# Patient Record
Sex: Male | Born: 1935 | Race: White | Hispanic: No | Smoking: Former smoker
Health system: Southern US, Community
[De-identification: ages and names within clinical notes are randomized; demographics above are authoritative.]

## PROBLEM LIST (undated history)

## (undated) DIAGNOSIS — F0390 Unspecified dementia without behavioral disturbance: Secondary | ICD-10-CM

## (undated) DIAGNOSIS — D519 Vitamin B12 deficiency anemia, unspecified: Secondary | ICD-10-CM

## (undated) DIAGNOSIS — I10 Essential (primary) hypertension: Secondary | ICD-10-CM

## (undated) DIAGNOSIS — E119 Type 2 diabetes mellitus without complications: Secondary | ICD-10-CM

## (undated) DIAGNOSIS — E785 Hyperlipidemia, unspecified: Secondary | ICD-10-CM

## (undated) DIAGNOSIS — M21379 Foot drop, unspecified foot: Secondary | ICD-10-CM

---

## 2018-03-12 ENCOUNTER — Emergency Department (HOSPITAL_COMMUNITY)
Admission: EM | Admit: 2018-03-12 | Discharge: 2018-03-13 | Disposition: A | Discharge status: Home / Self Care | Payer: Medicare Other | Attending: Emergency Medicine | Admitting: Emergency Medicine

## 2018-03-12 ENCOUNTER — Other Ambulatory Visit: Payer: Self-pay

## 2018-03-12 ENCOUNTER — Emergency Department (HOSPITAL_COMMUNITY): Payer: Medicare Other

## 2018-03-12 ENCOUNTER — Encounter (HOSPITAL_COMMUNITY): Payer: Self-pay

## 2018-03-12 DIAGNOSIS — Y939 Activity, unspecified: Secondary | ICD-10-CM | POA: Insufficient documentation

## 2018-03-12 DIAGNOSIS — F0390 Unspecified dementia without behavioral disturbance: Secondary | ICD-10-CM | POA: Insufficient documentation

## 2018-03-12 DIAGNOSIS — M542 Cervicalgia: Secondary | ICD-10-CM | POA: Insufficient documentation

## 2018-03-12 DIAGNOSIS — W010XXA Fall on same level from slipping, tripping and stumbling without subsequent striking against object, initial encounter: Secondary | ICD-10-CM | POA: Insufficient documentation

## 2018-03-12 DIAGNOSIS — Y92129 Unspecified place in nursing home as the place of occurrence of the external cause: Secondary | ICD-10-CM | POA: Diagnosis not present

## 2018-03-12 DIAGNOSIS — W19XXXA Unspecified fall, initial encounter: Secondary | ICD-10-CM

## 2018-03-12 DIAGNOSIS — Z79899 Other long term (current) drug therapy: Secondary | ICD-10-CM | POA: Insufficient documentation

## 2018-03-12 DIAGNOSIS — Y999 Unspecified external cause status: Secondary | ICD-10-CM | POA: Diagnosis not present

## 2018-03-12 LAB — CBC WITH DIFFERENTIAL/PLATELET
Basophils Absolute: 0 10*3/uL (ref 0.0–0.1)
Basophils Relative: 0 %
Eosinophils Absolute: 0.1 10*3/uL (ref 0.0–0.7)
Eosinophils Relative: 1 %
HEMATOCRIT: 37.2 % — AB (ref 39.0–52.0)
Hemoglobin: 12.4 g/dL — ABNORMAL LOW (ref 13.0–17.0)
LYMPHS ABS: 0.8 10*3/uL (ref 0.7–4.0)
LYMPHS PCT: 9 %
MCH: 30.1 pg (ref 26.0–34.0)
MCHC: 33.3 g/dL (ref 30.0–36.0)
MCV: 90.3 fL (ref 78.0–100.0)
MONO ABS: 0.7 10*3/uL (ref 0.1–1.0)
MONOS PCT: 8 %
NEUTROS ABS: 6.8 10*3/uL (ref 1.7–7.7)
NEUTROS PCT: 82 %
Platelets: 159 10*3/uL (ref 150–400)
RBC: 4.12 MIL/uL — ABNORMAL LOW (ref 4.22–5.81)
RDW: 13.3 % (ref 11.5–15.5)
WBC: 8.3 10*3/uL (ref 4.0–10.5)

## 2018-03-12 LAB — TROPONIN I: Troponin I: <0.03 ng/mL (ref <0.03)

## 2018-03-12 LAB — URINALYSIS, ROUTINE W REFLEX MICROSCOPIC
Bilirubin Urine: NEGATIVE
GLUCOSE, UA: 50 mg/dL — AB
KETONES UR: NEGATIVE mg/dL
Leukocytes, UA: NEGATIVE
NITRITE: NEGATIVE
PH: 5 (ref 5.0–8.0)
Protein, ur: 30 mg/dL — AB
SPECIFIC GRAVITY, URINE: 1.016 (ref 1.005–1.030)

## 2018-03-12 LAB — BASIC METABOLIC PANEL
ANION GAP: 10 (ref 5–15)
BUN: 28 mg/dL — ABNORMAL HIGH (ref 6–20)
CHLORIDE: 111 mmol/L (ref 101–111)
CO2: 25 mmol/L (ref 22–32)
Calcium: 8.6 mg/dL — ABNORMAL LOW (ref 8.9–10.3)
Creatinine, Ser: 1.52 mg/dL — ABNORMAL HIGH (ref 0.61–1.24)
GFR calc non Af Amer: 41 mL/min — ABNORMAL LOW (ref >60)
GFR, EST AFRICAN AMERICAN: 47 mL/min — AB (ref >60)
GLUCOSE: 99 mg/dL (ref 65–99)
POTASSIUM: 3.8 mmol/L (ref 3.5–5.1)
Sodium: 146 mmol/L — ABNORMAL HIGH (ref 135–145)

## 2018-03-12 LAB — CBG MONITORING, ED: GLUCOSE-CAPILLARY: 105 mg/dL — AB (ref 65–99)

## 2018-03-12 MED ORDER — SODIUM CHLORIDE 0.9 % IV BOLUS
1000.0000 mL | Freq: Once | INTRAVENOUS | Status: AC
  Administered 2018-03-12: 1000 mL via INTRAVENOUS

## 2018-03-12 NOTE — ED Notes (Signed)
Bed: [id number] Expected date:  Expected time:  Means of arrival:  Comments: Ems fall

## 2018-03-12 NOTE — ED Triage Notes (Signed)
EMS reports from [facility] in dining room at facility, unwitnessed fall, Pt c/o neck pain and states it was before fall. No other visible injuries and Pt denies pain anywhere else or from fall. Hx of dementia. `  BP 90/60 HR 84 Resp 16 SP02 96 RA  20 R hand  100ML NS enroute

## 2018-03-12 NOTE — Discharge Instructions (Addendum)
Labs and imaging are ok. Continue all home medications. Close monitor for changes in behavior, intake, output.

## 2018-03-12 NOTE — ED Notes (Signed)
[facility] has been called for transport. 

## 2018-03-12 NOTE — ED Notes (Signed)
Patient was ambulated around room, patient can not walk without assistance

## 2018-03-12 NOTE — ED Provider Notes (Signed)
[facility]-EMERGENCY DEPT Provider Note   CSN: [id number] Arrival date & time: 03/12/18  1346     History   Chief Complaint Chief Complaint  Patient presents with  . Neck Pain  . Fall    HPI Levi Russell is a 84 y.o. male  With history of dementia is here for evaluation of fall at [facility], witnessed. History is limited due to underlying dementia, level V caveat applies. Patient is alert and oriented to self and place but unsure of events, and time. He has no pain but states he is tired. History obtained from chart review and a phone call with RN at [facility]. Per [facility], patient was walking with his roller walker when a wheel got caught in a chair, he fell forward landing on the ground. RN witnessed fall, patient hit his head on the ground. There was no LOC. Patient briefly complained of neck pain but he denies this currently. Patient does not take any blood thinners. [facility] at [facility] states patient has been eating, drinking and voiding at baseline. No fevers, cough, changes in bowel movements. He was at his cognitive baseline after the fall. Aggravating factors: none. Alleviating factors: none. No associated symptoms.   HPI  History reviewed. No pertinent past medical history.  There are no active problems to display for this patient.   History reviewed. No pertinent surgical history.      Home Medications    Prior to Admission medications   Medication Sig Start Date End Date Taking? Authorizing Provider  clotrimazole (LOTRIMIN) 1 % cream Apply 1 application topically 2 (two) times daily.   Yes [provider]  Cyanocobalamin (VITAMIN B 12 PO) Take 500 mg by mouth daily.   Yes [provider]  LORazepam (ATIVAN) 0.5 MG tablet Take 0.5 mg by mouth every 6 (six) hours as needed for anxiety.   Yes [provider]  metFORMIN (GLUCOPHAGE) 1000 MG tablet Take 1,000 mg by mouth 2 (two) times daily with a  meal.   Yes [provider]  oseltamivir (TAMIFLU) 30 MG capsule Take 30 mg by mouth.   Yes [provider]  risperiDONE (RISPERDAL) 1 MG tablet Take 0.5 mg by mouth. Take twice daily and every 4 hours as needed for psychosis   Yes [provider]  traZODone (DESYREL) 50 MG tablet Take 50 mg by mouth at bedtime as needed for sleep.   Yes [provider]    Family History History reviewed. No pertinent family history.  Social History Social History   Tobacco Use  . Smoking status: Former Games developermoker  . Smokeless tobacco: Never Used  Substance Use Topics  . Alcohol use: Not on file  . Drug use: Not on file     Allergies   Patient has no allergy information on record.   Review of Systems Review of Systems  Musculoskeletal: Positive for neck pain.  Psychiatric/Behavioral:       Dementia   All other systems reviewed and are negative.    Physical Exam Updated Vital Signs BP 118/78   Pulse 68   Temp 98.1 F (36.7 C) (Oral)   Resp 16   Ht 5\' 8"  (1.727 m)   Wt 68 kg (150 lb)   SpO2 98%   BMI 22.81 kg/m   Physical Exam  Constitutional: He is oriented to person, place, and time. He appears well-developed and well-nourished. He is cooperative. He is easily aroused. No distress.  Asleep but  easily arousable to voice. Pleasantly confused.   HENT:  No abrasions, lacerations, erythema or signs of facial or head injury No scalp, facial or nasal bone tenderness No Raccoon's eyes. No Battle's sign. No hemotympanum, bilaterally. No epistaxis, septum midline No intraoral bleeding or injury  Eyes:  Lids normal EOMs and PERRL intact without pain No conjunctival injection  Neck:  No cervical spinous process tenderness No cervical paraspinal muscular tenderness Full active ROM of cervical spine Trachea midline  Cardiovascular: Normal rate, regular rhythm, S1 normal, S2 normal and normal heart sounds. Exam reveals no distant heart sounds and no  friction rub.  No murmur heard. Pulses:      Carotid pulses are 2+ on the right side, and 2+ on the left side.      Radial pulses are 2+ on the right side, and 2+ on the left side.       Dorsalis pedis pulses are 2+ on the right side, and 2+ on the left side.  Pulmonary/Chest: Effort normal and breath sounds normal. No respiratory distress. He has no decreased breath sounds.  No chest wall tenderness Equal and symmetric chest wall expansion   Abdominal:  Abdomen is soft NTND  Musculoskeletal: Normal range of motion. He exhibits no deformity.  Extremities: Normal, painless PROM of upper and lower extremities without pain or deficit. No obvious deformities.  CTL spine: no midline tenderness Pelvis: No obvious instability with Ap/L compression. Painless PROM of bilateral hips.   Neurological: He is alert, oriented to person, place, and time and easily aroused.  A&O to self, place and DOB only. Speech is fluent without obvious dysarthria or dysphasia. Strength 5/5 with hand grip and ankle F/E.   Sensation to light touch intact in hands and feet. No truncal sway. No pronator drift.  Normal finger-to-nose and finger tapping.  CN I and VIII not tested. CN II-XII grossly intact bilaterally.      ED Treatments / Results  Labs (all labs ordered are listed, but only abnormal results are displayed) Labs Reviewed  URINALYSIS, ROUTINE W REFLEX MICROSCOPIC - Abnormal; Notable for the following components:      Result Value   Glucose, UA 50 (*)    Hgb urine dipstick SMALL (*)    Protein, ur 30 (*)    Bacteria, UA RARE (*)    Squamous Epithelial / LPF 0-5 (*)    All other components within normal limits  BASIC METABOLIC PANEL - Abnormal; Notable for the following components:   Sodium 146 (*)    BUN 28 (*)    Creatinine, Ser 1.52 (*)    Calcium 8.6 (*)    GFR calc non Af Amer 41 (*)    GFR calc Af Amer 47 (*)    All other components within normal limits  CBC WITH DIFFERENTIAL/PLATELET -  Abnormal; Notable for the following components:   RBC 4.12 (*)    Hemoglobin 12.4 (*)    HCT 37.2 (*)    All other components within normal limits  CBG MONITORING, ED - Abnormal; Notable for the following components:   Glucose-Capillary 105 (*)    All other components within normal limits  TROPONIN I  CBC WITH DIFFERENTIAL/PLATELET    EKG EKG Interpretation  Date/Time:  Monday March 12 2018 14:51:34 EDT Ventricular Rate:  78 PR Interval:    QRS Duration: 134 QT Interval:  405 QTC Calculation: 462 R Axis:   43 Text Interpretation:  Sinus rhythm Left bundle branch block No STEMI.  Confirmed  by Alona Bene [phone]) on 03/12/2018 2:54:55 PM Also confirmed by Alona Bene [phone]), editor Sheppard Evens ([id number])  on 03/12/2018 3:09:15 PM   Radiology Dg Chest 2 View  Result Date: 03/12/2018 CLINICAL DATA:  Unwitnessed fall.  Patient complains of neck pain. EXAM: CHEST - 2 VIEW COMPARISON:  12/22/2017 FINDINGS: Cardiomediastinal silhouette is normal. Mediastinal contours appear intact. Mild calcific atherosclerotic disease of the aorta. There is no evidence of focal airspace consolidation, pleural effusion or pneumothorax. Osteoarthritic changes of bilateral shoulders, which chronic posttraumatic versus arthritic changes at the left acromioclavicular joint. Soft tissues are grossly normal. IMPRESSION: No active cardiopulmonary disease. Electronically Signed   By: Ted Mcalpine M.D.   On: 03/12/2018 15:37   Ct Head Wo Contrast  Result Date: 03/12/2018 CLINICAL DATA:  Unwitnessed fall with neck pain. History of dementia. EXAM: CT HEAD WITHOUT CONTRAST CT CERVICAL SPINE WITHOUT CONTRAST TECHNIQUE: Multidetector CT imaging of the head and cervical spine was performed following the standard protocol without intravenous contrast. Multiplanar CT image reconstructions of the cervical spine were also generated. COMPARISON:  12/22/2017 head CT FINDINGS: CT HEAD FINDINGS Brain: No evidence of acute  infarction, hemorrhage, hydrocephalus, extra-axial collection or mass lesion/mass effect. Moderate cortical atrophy most prominent in the high frontal parietal region. Patient has history of dementia. Low densities below the bilateral putamen, most likely dilated perivascular spaces. Vascular: Atherosclerotic calcification.  No high-density vessel. Skull: Negative. Sinuses/Orbits: Negative CT CERVICAL SPINE FINDINGS Alignment: Normal alignment. Skull base and vertebrae: Negative for fracture Soft tissues and spinal canal: No prevertebral fluid or swelling. No visible canal hematoma. Nonspecific asymmetric fatty atrophy of the right parotid. Disc levels: Generalized disc narrowing and facet spurring. 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 Upper chest: Negative IMPRESSION: No evidence of acute intracranial or cervical spine injury. Electronically Signed   By: Marnee Spring M.D.   On: 03/12/2018 15:44   Ct Cervical Spine Wo Contrast  Result Date: 03/12/2018 CLINICAL DATA:  Unwitnessed fall with neck pain. History of dementia. EXAM: CT HEAD WITHOUT CONTRAST CT CERVICAL SPINE WITHOUT CONTRAST TECHNIQUE: Multidetector CT imaging of the head and cervical spine was performed following the standard protocol without intravenous contrast. Multiplanar CT image reconstructions of the cervical spine were also generated. COMPARISON:  12/22/2017 head CT FINDINGS: CT HEAD FINDINGS Brain: No evidence of acute infarction, hemorrhage, hydrocephalus, extra-axial collection or mass lesion/mass effect. Moderate cortical atrophy most prominent in the high frontal parietal region. Patient has history of dementia. Low densities below the bilateral putamen, most likely dilated perivascular spaces. Vascular: Atherosclerotic calcification.  No high-density vessel. Skull: Negative. Sinuses/Orbits: Negative CT CERVICAL SPINE FINDINGS Alignment: Normal alignment. Skull base and vertebrae: Negative for fracture Soft tissues and spinal canal: No  prevertebral fluid or swelling. No visible canal hematoma. Nonspecific asymmetric fatty atrophy of the right parotid. Disc levels: Generalized disc narrowing and facet spurring. 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 Upper chest: Negative IMPRESSION: No evidence of acute intracranial or cervical spine injury. Electronically Signed   By: Marnee Spring M.D.   On: 03/12/2018 15:44    Procedures Procedures (including critical care time)  Medications Ordered in ED Medications  sodium chloride 0.9 % bolus 1,000 mL (0 mLs Intravenous Stopped 03/12/18 1713)     Initial Impression / Assessment and Plan / ED Course  I have reviewed the triage vital signs and the nursing notes.  Pertinent labs & imaging results that were available during my care of the patient were reviewed by me and considered in my medical decision making (see chart for details).  84 year old with dementia here after witnessed, mechanical fall. History obtained from nurse at [facility] who witnessed the fall. Patient has been at his cognitive baseline before and after fall.  No LOC. No anticoagulants.  RN at [facility] states blood pressure usually in the low 100s. He received 500 mL normal saline on route.  Initially hypotensive in ED x 2, slightly improving. No fever. Exam otherwise reassuring without signs of chest, spine, pelvis, extremity injury today. Given age, initial hypotension, limited history due to underlying dementia, will obtain basic labs, CXR, EKG. CT head/cervical spine ordered. Ddx includes mild dehydration, less likely occult infectious etiology causing hypotension. No beta blockers on board.   Final Clinical Impressions(s) / ED Diagnoses   Labs, chest x-ray, urinalysis, CTs reviewed without any acute insult. Patient tolerated fluid challenge. Patient ambulated around room with assistance which is his baseline. His vital signs including blood pressure have remained stable and within normal limits. I reevaluated  patient and he is in no acute distress.Patient deemed appropriate for discharge at this time close observation for potential clinical decline over the next 48 hours. Patient awaiting [facility] Sheets for transport back to [facility] Patient shared with Dr. Jacqulyn Bath. Final diagnoses:  Fall, initial encounter    ED Discharge Orders    None       Jerrell Mylar 03/12/18 2149    Maia Plan, MD 03/13/18 [phone]

## 2018-10-25 ENCOUNTER — Emergency Department (HOSPITAL_COMMUNITY)
Admission: EM | Admit: 2018-10-25 | Discharge: 2018-10-25 | Disposition: A | Discharge status: Home / Self Care | Payer: Medicare Other | Attending: Emergency Medicine | Admitting: Emergency Medicine

## 2018-10-25 ENCOUNTER — Emergency Department (HOSPITAL_COMMUNITY): Payer: Medicare Other

## 2018-10-25 ENCOUNTER — Encounter (HOSPITAL_COMMUNITY): Payer: Self-pay | Admitting: Radiology

## 2018-10-25 DIAGNOSIS — Z79899 Other long term (current) drug therapy: Secondary | ICD-10-CM | POA: Diagnosis not present

## 2018-10-25 DIAGNOSIS — Z7984 Long term (current) use of oral hypoglycemic drugs: Secondary | ICD-10-CM | POA: Diagnosis not present

## 2018-10-25 DIAGNOSIS — Y92122 Bedroom in nursing home as the place of occurrence of the external cause: Secondary | ICD-10-CM | POA: Insufficient documentation

## 2018-10-25 DIAGNOSIS — Z87891 Personal history of nicotine dependence: Secondary | ICD-10-CM | POA: Diagnosis not present

## 2018-10-25 DIAGNOSIS — Y999 Unspecified external cause status: Secondary | ICD-10-CM | POA: Insufficient documentation

## 2018-10-25 DIAGNOSIS — Y9389 Activity, other specified: Secondary | ICD-10-CM | POA: Diagnosis not present

## 2018-10-25 DIAGNOSIS — S0101XA Laceration without foreign body of scalp, initial encounter: Secondary | ICD-10-CM | POA: Diagnosis not present

## 2018-10-25 DIAGNOSIS — I10 Essential (primary) hypertension: Secondary | ICD-10-CM | POA: Insufficient documentation

## 2018-10-25 DIAGNOSIS — E119 Type 2 diabetes mellitus without complications: Secondary | ICD-10-CM | POA: Diagnosis not present

## 2018-10-25 DIAGNOSIS — W06XXXA Fall from bed, initial encounter: Secondary | ICD-10-CM | POA: Diagnosis not present

## 2018-10-25 DIAGNOSIS — Z23 Encounter for immunization: Secondary | ICD-10-CM | POA: Diagnosis not present

## 2018-10-25 DIAGNOSIS — S51811A Laceration without foreign body of right forearm, initial encounter: Secondary | ICD-10-CM | POA: Insufficient documentation

## 2018-10-25 DIAGNOSIS — S0990XA Unspecified injury of head, initial encounter: Secondary | ICD-10-CM

## 2018-10-25 DIAGNOSIS — F0390 Unspecified dementia without behavioral disturbance: Secondary | ICD-10-CM | POA: Diagnosis not present

## 2018-10-25 HISTORY — DX: Unspecified dementia, unspecified severity, without behavioral disturbance, psychotic disturbance, mood disturbance, and anxiety: F03.90

## 2018-10-25 HISTORY — DX: Type 2 diabetes mellitus without complications: E11.9

## 2018-10-25 HISTORY — DX: Essential (primary) hypertension: I10

## 2018-10-25 HISTORY — DX: Hyperlipidemia, unspecified: E78.5

## 2018-10-25 HISTORY — DX: Foot drop, unspecified foot: M21.379

## 2018-10-25 HISTORY — DX: Vitamin B12 deficiency anemia, unspecified: D51.9

## 2018-10-25 LAB — CBC WITH DIFFERENTIAL/PLATELET
Abs Immature Granulocytes: 0.04 10*3/uL (ref 0.00–0.07)
Basophils Absolute: 0 10*3/uL (ref 0.0–0.1)
Basophils Relative: 1 %
Eosinophils Absolute: 0.1 10*3/uL (ref 0.0–0.5)
Eosinophils Relative: 1 %
HCT: 37.9 % — ABNORMAL LOW (ref 39.0–52.0)
Hemoglobin: 12.1 g/dL — ABNORMAL LOW (ref 13.0–17.0)
Immature Granulocytes: 1 %
Lymphocytes Relative: 11 %
Lymphs Abs: 0.8 10*3/uL (ref 0.7–4.0)
MCH: 29.7 pg (ref 26.0–34.0)
MCHC: 31.9 g/dL (ref 30.0–36.0)
MCV: 93.1 fL (ref 80.0–100.0)
MONO ABS: 0.8 10*3/uL (ref 0.1–1.0)
MONOS PCT: 11 %
Neutro Abs: 5.6 10*3/uL (ref 1.7–7.7)
Neutrophils Relative %: 75 %
Platelets: 243 10*3/uL (ref 150–400)
RBC: 4.07 MIL/uL — ABNORMAL LOW (ref 4.22–5.81)
RDW: 13.1 % (ref 11.5–15.5)
WBC: 7.4 10*3/uL (ref 4.0–10.5)
nRBC: 0 % (ref 0.0–0.2)

## 2018-10-25 LAB — BASIC METABOLIC PANEL
Anion gap: 8 (ref 5–15)
BUN: 21 mg/dL (ref 8–23)
CO2: 28 mmol/L (ref 22–32)
CREATININE: 0.81 mg/dL (ref 0.61–1.24)
Calcium: 8.5 mg/dL — ABNORMAL LOW (ref 8.9–10.3)
Chloride: 105 mmol/L (ref 98–111)
GFR calc Af Amer: >60 mL/min (ref >60)
GFR calc non Af Amer: >60 mL/min (ref >60)
Glucose, Bld: 87 mg/dL (ref 70–99)
Potassium: 3.6 mmol/L (ref 3.5–5.1)
Sodium: 141 mmol/L (ref 135–145)

## 2018-10-25 LAB — URINALYSIS, ROUTINE W REFLEX MICROSCOPIC
Bilirubin Urine: NEGATIVE
Glucose, UA: NEGATIVE mg/dL
Hgb urine dipstick: NEGATIVE
Ketones, ur: 5 mg/dL — AB
Leukocytes, UA: NEGATIVE
Nitrite: NEGATIVE
Protein, ur: NEGATIVE mg/dL
SPECIFIC GRAVITY, URINE: 1.02 (ref 1.005–1.030)
pH: 5 (ref 5.0–8.0)

## 2018-10-25 MED ORDER — TETANUS-DIPHTH-ACELL PERTUSSIS 5-2.5-18.5 LF-MCG/0.5 IM SUSP
0.5000 mL | Freq: Once | INTRAMUSCULAR | Status: AC
  Administered 2018-10-25: 0.5 mL via INTRAMUSCULAR
  Filled 2018-10-25: qty 0.5

## 2018-10-25 NOTE — ED Provider Notes (Signed)
[facility]   CSN: [id number] Arrival date & time: 10/25/18  [id number]     History   Chief Complaint Chief Complaint  Patient presents with  . Head Injury    HPI Levi Russell is a 84 y.o. male.  5 caveat secondary to dementia.  84 year old male not on anticoagulation here after an unwitnessed fall at this facility [facility].  Patient is complaining of some pain on the right side of his head.  He otherwise denies any complaints.  He has no recollection of the fall.  The history is provided by the patient and the EMS personnel.  Head Injury   Incident onset: unknown. He came to the ER via EMS. Length of episode of loss of consciousness: unknown. The volume of blood lost was minimal. The quality of the pain is described as throbbing. The pain is mild. The pain has been constant since the injury. Pertinent negatives include no numbness, no blurred vision and no vomiting. He was found conscious by EMS personnel. He has tried nothing for the symptoms. The treatment provided no relief.    Past Medical History:  Diagnosis Date  . B12 deficiency anemia   . Dementia (HCC)   . Diabetes mellitus without complication (HCC)    Type 2  . Foot drop   . Hyperlipidemia   . Hypertension     There are no active problems to display for this patient.   History reviewed. No pertinent surgical history.      Home Medications    Prior to Admission medications   Medication Sig Start Date End Date Taking? Authorizing Provider  clotrimazole (LOTRIMIN) 1 % cream Apply 1 application topically 2 (two) times daily.    [provider]  Cyanocobalamin (VITAMIN B 12 PO) Take 500 mg by mouth daily.    [provider]  LORazepam (ATIVAN) 0.5 MG tablet Take 0.5 mg by mouth every 6 (six) hours as needed for anxiety.    [provider]  metFORMIN (GLUCOPHAGE) 1000 MG tablet Take 1,000 mg by mouth 2 (two) times daily with a meal.     [provider]  oseltamivir (TAMIFLU) 30 MG capsule Take 30 mg by mouth.    [provider]  risperiDONE (RISPERDAL) 1 MG tablet Take 0.5 mg by mouth. Take twice daily and every 4 hours as needed for psychosis    [provider]  traZODone (DESYREL) 50 MG tablet Take 50 mg by mouth at bedtime as needed for sleep.    [provider]    Family History No family history on file.  Social History Social History   Tobacco Use  . Smoking status: Former Games developermoker  . Smokeless tobacco: Never Used  Substance Use Topics  . Alcohol use: Not on file  . Drug use: Not on file     Allergies   Patient has no allergy information on record.   Review of Systems Review of Systems  Constitutional: Negative for fever.  HENT: Negative for sore throat.   Eyes: Negative for blurred vision and visual disturbance.  Respiratory: Negative for shortness of breath.   Cardiovascular: Negative for chest pain.  Gastrointestinal: Negative for abdominal pain and vomiting.  Genitourinary: Negative for dysuria.  Musculoskeletal: Negative for neck pain.  Skin: Positive for wound. Negative for rash.  Neurological: Positive for headaches. Negative for numbness.     Physical Exam Updated Vital Signs BP (!) 106/59 (BP Location: Right Arm)   Pulse 83  Temp 97.6 F (36.4 C) (Oral)   Resp 15   SpO2 99%   Physical Exam  Constitutional: He appears well-developed and well-nourished.  HENT:  Head: Normocephalic.  Right Ear: External ear normal.  Left Ear: External ear normal.  Mouth/Throat: Oropharynx is clear and moist.  Is approximately 1 cm laceration on the right temporal area of his skull.  There is a small hematoma there  Eyes: Conjunctivae and EOM are normal.  Neck: Neck supple. No tracheal deviation present.  Cardiovascular: Normal rate and regular rhythm.  No murmur heard. Pulmonary/Chest: Effort normal and breath sounds normal. No respiratory distress.    Abdominal: Soft. There is no tenderness.  Musculoskeletal: He exhibits no edema or deformity.  He has approximately 2 inch skin tear on his right forearm.  Full range of motion of extremities without any limitations.  Neurological: He is alert.  Patient is alert and conversant.  He is not oriented to time.  He knows he is in a hospital.  He is moving all extremities non-focally.  Skin: Skin is warm and dry. Capillary refill takes less than 2 seconds.  Psychiatric: He has a normal mood and affect.  Nursing note and vitals reviewed.    ED Treatments / Results  Labs (all labs ordered are listed, but only abnormal results are displayed) Labs Reviewed  BASIC METABOLIC PANEL - Abnormal; Notable for the following components:      Result Value   Calcium 8.5 (*)    All other components within normal limits  CBC WITH DIFFERENTIAL/PLATELET - Abnormal; Notable for the following components:   RBC 4.07 (*)    Hemoglobin 12.1 (*)    HCT 37.9 (*)    All other components within normal limits  URINALYSIS, ROUTINE W REFLEX MICROSCOPIC - Abnormal; Notable for the following components:   Ketones, ur 5 (*)    All other components within normal limits    EKG None  Radiology Ct Head Wo Contrast  Result Date: 10/25/2018 CLINICAL DATA:  Unwitnessed fall EXAM: CT HEAD WITHOUT CONTRAST CT CERVICAL SPINE WITHOUT CONTRAST TECHNIQUE: Multidetector CT imaging of the head and cervical spine was performed following the standard protocol without intravenous contrast. Multiplanar CT image reconstructions of the cervical spine were also generated. COMPARISON:  03/12/2018 FINDINGS: CT HEAD FINDINGS Brain: Diffuse atrophic changes are identified. No findings to suggest acute hemorrhage, acute infarction or space-occupying mass lesion are noted. Vascular: No hyperdense vessel or unexpected calcification. Skull: Normal. Negative for fracture or focal lesion. Sinuses/Orbits: No acute finding. Other: Mild scalp hematoma  is noted on the right consistent with the recent injury. CT CERVICAL SPINE FINDINGS Alignment: Within normal limits. Skull base and vertebrae: 7 cervical segments are well visualized. Vertebral body height is well maintained. Mild osteophytic changes are noted throughout the cervical spine. Mild facet hypertrophic changes are noted as well. No acute fracture or acute facet abnormality is noted. Soft tissues and spinal canal: Vascular calcifications are noted. No other significant soft tissue abnormality is noted. Upper chest: Within normal limits. Other: None IMPRESSION: CT of the head: Mild atrophic changes without acute intracranial abnormality. Small right scalp hematoma. CT of the cervical spine: Degenerative change without acute abnormality. Electronically Signed   By: Alcide Clever M.D.   On: 10/25/2018 09:59   Ct Cervical Spine Wo Contrast  Result Date: 10/25/2018 CLINICAL DATA:  Unwitnessed fall EXAM: CT HEAD WITHOUT CONTRAST CT CERVICAL SPINE WITHOUT CONTRAST TECHNIQUE: Multidetector CT imaging of the head and cervical spine was performed following  the standard protocol without intravenous contrast. Multiplanar CT image reconstructions of the cervical spine were also generated. COMPARISON:  03/12/2018 FINDINGS: CT HEAD FINDINGS Brain: Diffuse atrophic changes are identified. No findings to suggest acute hemorrhage, acute infarction or space-occupying mass lesion are noted. Vascular: No hyperdense vessel or unexpected calcification. Skull: Normal. Negative for fracture or focal lesion. Sinuses/Orbits: No acute finding. Other: Mild scalp hematoma is noted on the right consistent with the recent injury. CT CERVICAL SPINE FINDINGS Alignment: Within normal limits. Skull base and vertebrae: 7 cervical segments are well visualized. Vertebral body height is well maintained. Mild osteophytic changes are noted throughout the cervical spine. Mild facet hypertrophic changes are noted as well. No acute fracture or  acute facet abnormality is noted. Soft tissues and spinal canal: Vascular calcifications are noted. No other significant soft tissue abnormality is noted. Upper chest: Within normal limits. Other: None IMPRESSION: CT of the head: Mild atrophic changes without acute intracranial abnormality. Small right scalp hematoma. CT of the cervical spine: Degenerative change without acute abnormality. Electronically Signed   By: Alcide Clever M.D.   On: 10/25/2018 09:59    Procedures Procedures (including critical care time)  Medications Ordered in ED Medications  Tdap (BOOSTRIX) injection 0.5 mL (has no administration in time range)     Initial Impression / Assessment and Plan / ED Course  I have reviewed the triage vital signs and the nursing notes.  Pertinent labs & imaging results that were available during my care of the patient were reviewed by me and considered in my medical decision making (see chart for details).  Clinical Course as of Oct 25 1528  Thu Oct 25, 2018  1006 Patient CT head and C-spine do not show any acute findings.  His lab work is also unremarkable.  I do not believe the laceration on his scalp requires suturing currently.  Anticipate discharge back to his facility.   [MB]    Clinical Course User Index [MB] Terrilee Files, MD     Final Clinical Impressions(s) / ED Diagnoses   Final diagnoses:  Injury of head, initial encounter  Scalp laceration, initial encounter    ED Discharge Orders    None       Terrilee Files, MD 10/25/18 1530

## 2018-10-25 NOTE — Discharge Instructions (Addendum)
You were evaluated in the emergency department for a fall and head injury.  You had a CAT scan of your neck and cervical spine along with some basic lab work.  You had a small laceration on the right side of your head that did not need sutures or staples.  Recommend ice to the area to decrease swelling and Tylenol as needed for pain.  Follow-up with your doctor and return if any worsening symptoms.

## 2018-10-25 NOTE — ED Notes (Signed)
[facility] called for transport.  

## 2018-10-25 NOTE — ED Notes (Signed)
Report given to Elease HashimotoPatricia, Charity fundraiserN at [facility]

## 2018-10-25 NOTE — ED Notes (Signed)
Bed: WA04 Expected date:  Expected time:  Means of arrival:  Comments: Elderly- fall 

## 2018-10-25 NOTE — ED Notes (Signed)
[address] [name]

## 2018-10-25 NOTE — ED Triage Notes (Signed)
Pt BIB [facility] from [facility] for an unwitnessed fall. Pt fell from bed to floor with a r side head injury and small lac to right elbow. Hx dementia.  Unknown if family has been notified.

## 2018-11-29 ENCOUNTER — Emergency Department (HOSPITAL_COMMUNITY)
Admission: EM | Admit: 2018-11-29 | Discharge: 2018-11-29 | Disposition: A | Discharge status: Home / Self Care | Payer: Medicare Other | Attending: Emergency Medicine | Admitting: Emergency Medicine

## 2018-11-29 ENCOUNTER — Encounter (HOSPITAL_COMMUNITY): Payer: Self-pay

## 2018-11-29 ENCOUNTER — Other Ambulatory Visit: Payer: Self-pay

## 2018-11-29 ENCOUNTER — Emergency Department (HOSPITAL_COMMUNITY): Payer: Medicare Other

## 2018-11-29 DIAGNOSIS — Y92129 Unspecified place in nursing home as the place of occurrence of the external cause: Secondary | ICD-10-CM | POA: Insufficient documentation

## 2018-11-29 DIAGNOSIS — Z79899 Other long term (current) drug therapy: Secondary | ICD-10-CM | POA: Insufficient documentation

## 2018-11-29 DIAGNOSIS — S0003XA Contusion of scalp, initial encounter: Secondary | ICD-10-CM | POA: Diagnosis not present

## 2018-11-29 DIAGNOSIS — Y9389 Activity, other specified: Secondary | ICD-10-CM | POA: Insufficient documentation

## 2018-11-29 DIAGNOSIS — W19XXXA Unspecified fall, initial encounter: Secondary | ICD-10-CM

## 2018-11-29 DIAGNOSIS — Z87891 Personal history of nicotine dependence: Secondary | ICD-10-CM | POA: Insufficient documentation

## 2018-11-29 DIAGNOSIS — W0110XA Fall on same level from slipping, tripping and stumbling with subsequent striking against unspecified object, initial encounter: Secondary | ICD-10-CM | POA: Insufficient documentation

## 2018-11-29 DIAGNOSIS — F0390 Unspecified dementia without behavioral disturbance: Secondary | ICD-10-CM | POA: Diagnosis not present

## 2018-11-29 DIAGNOSIS — Z7984 Long term (current) use of oral hypoglycemic drugs: Secondary | ICD-10-CM | POA: Diagnosis not present

## 2018-11-29 DIAGNOSIS — E119 Type 2 diabetes mellitus without complications: Secondary | ICD-10-CM | POA: Diagnosis not present

## 2018-11-29 DIAGNOSIS — Y998 Other external cause status: Secondary | ICD-10-CM | POA: Insufficient documentation

## 2018-11-29 DIAGNOSIS — I10 Essential (primary) hypertension: Secondary | ICD-10-CM | POA: Insufficient documentation

## 2018-11-29 DIAGNOSIS — S0990XA Unspecified injury of head, initial encounter: Secondary | ICD-10-CM | POA: Diagnosis present

## 2018-11-29 NOTE — ED Triage Notes (Signed)
Per EMS: Pt from [facility].  Pt had unwitnessed fall.  Pt is not on blood thinners, pt oriented at baseline (oriented to person at baseline).

## 2018-11-29 NOTE — Discharge Instructions (Addendum)
Continue ice for hematoma.  Continue fall precautions of facility.  There is no sign of injury on head or neck scan.

## 2018-11-29 NOTE — ED Notes (Signed)
Called [facility] to call report to nurse, only got in touch with receptionist.  Told them this pt was on his way via [facility].

## 2018-11-29 NOTE — ED Notes (Signed)
Cleaned pt's hematoma to R side of head with sterile saline and gauze.

## 2018-11-29 NOTE — ED Notes (Signed)
[facility] called for transport.  

## 2018-11-29 NOTE — ED Notes (Signed)
Bed: [facility] Expected date:  Expected time:  Means of arrival:  Comments: EMS 85yo fall dementia

## 2018-11-29 NOTE — ED Provider Notes (Signed)
[facility]-EMERGENCY DEPT Provider Note   CSN: [id number] Arrival date & time: 11/29/18  0808     History   Chief Complaint Chief Complaint  Patient presents with  . Fall    HPI Levi Russell is a 85 y.o. male.  Per EMS patient with unwitnessed fall at nursing home.  History of dementia.  Patient not on blood thinners.  Hematoma over the right side of his head.  Patient pleasant and has no complaints.  Denies any chest pain, shortness of breath.  The history is provided by the patient.  Fall  This is a new problem. The current episode started less than 1 hour ago. The problem occurs constantly. The problem has been resolved. Pertinent negatives include no chest pain, no abdominal pain, no headaches and no shortness of breath. Nothing aggravates the symptoms. Nothing relieves the symptoms. He has tried nothing for the symptoms. The treatment provided no relief.    Past Medical History:  Diagnosis Date  . B12 deficiency anemia   . Dementia (HCC)   . Diabetes mellitus without complication (HCC)    Type 2  . Foot drop   . Hyperlipidemia   . Hypertension     There are no active problems to display for this patient.   History reviewed. No pertinent surgical history.      Home Medications    Prior to Admission medications   Medication Sig Start Date End Date Taking? Authorizing Provider  metFORMIN (GLUCOPHAGE) 1000 MG tablet Take 1,000 mg by mouth 2 (two) times daily with a meal.   Yes [provider]  traZODone (DESYREL) 50 MG tablet Take 50 mg by mouth at bedtime as needed for sleep.   Yes [provider]  clotrimazole (LOTRIMIN) 1 % cream Apply 1 application topically 2 (two) times daily.    [provider]  LORazepam (ATIVAN) 0.5 MG tablet Take 0.5 mg by mouth every 6 (six) hours as needed for anxiety.    [provider]  risperiDONE (RISPERDAL) 0.5 MG tablet Take 0.5 mg by mouth 2 (two) times daily at 10 am and  4 pm.    [provider]  risperiDONE (RISPERDAL) 1 MG tablet Take 0.5 mg by mouth every 4 (four) hours as needed (psychosis).    [provider]  vitamin B-12 (CYANOCOBALAMIN) 500 MCG tablet Take 500 mcg by mouth daily.    [provider]    Family History History reviewed. No pertinent family history.  Social History Social History   Tobacco Use  . Smoking status: Former Games developer  . Smokeless tobacco: Never Used  Substance Use Topics  . Alcohol use: Not on file  . Drug use: Not on file     Allergies   Patient has no known allergies.   Review of Systems Review of Systems  Constitutional: Negative for chills and fever.  HENT: Negative for ear pain and sore throat.   Eyes: Negative for pain and visual disturbance.  Respiratory: Negative for cough and shortness of breath.   Cardiovascular: Negative for chest pain and palpitations.  Gastrointestinal: Negative for abdominal pain and vomiting.  Genitourinary: Negative for dysuria and hematuria.  Musculoskeletal: Negative for arthralgias and back pain.  Skin: Negative for color change and rash.  Neurological: Negative for seizures, syncope and headaches.  All other systems reviewed and are negative.    Physical Exam Updated Vital Signs BP 134/70   Pulse 69   Temp (!) 97.4 F (36.3 C)   Resp  16   Ht 5\' 7"  (1.702 m)   Wt 63.5 kg   SpO2 100%   BMI 21.93 kg/m   Physical Exam Vitals signs and nursing note reviewed.  Constitutional:      Appearance: He is well-developed.  HENT:     Head:     Comments: Hematoma over the right side of his temporal scalp    Nose: Nose normal.     Mouth/Throat:     Mouth: Mucous membranes are moist.  Eyes:     Extraocular Movements: Extraocular movements intact.     Conjunctiva/sclera: Conjunctivae normal.     Pupils: Pupils are equal, round, and reactive to light.  Neck:     Musculoskeletal: Neck supple.     Comments: In cervical collar Cardiovascular:       Rate and Rhythm: Normal rate and regular rhythm.     Pulses: Normal pulses.     Heart sounds: Normal heart sounds. No murmur.  Pulmonary:     Effort: Pulmonary effort is normal. No respiratory distress.     Breath sounds: Normal breath sounds.  Abdominal:     General: There is no distension.     Palpations: Abdomen is soft.     Tenderness: There is no abdominal tenderness.  Musculoskeletal: Normal range of motion.        General: No tenderness or deformity.     Comments: No midline spinal tenderness  Skin:    General: Skin is warm and dry.  Neurological:     General: No focal deficit present.     Mental Status: He is alert.     Comments: Patient moves all extremities, follows commands, grossly intact cranial nerves, sensation also grossly intact      ED Treatments / Results  Labs (all labs ordered are listed, but only abnormal results are displayed) Labs Reviewed - No data to display  EKG None  Radiology Dg Chest 2 View  Result Date: 11/29/2018 CLINICAL DATA:  Fall EXAM: CHEST - 2 VIEW COMPARISON:  03/12/2018 FINDINGS: Heart and mediastinal contours are within normal limits. No focal opacities or effusions. No acute bony abnormality. IMPRESSION: No active cardiopulmonary disease. Electronically Signed   By: Charlett NoseKevin  Dover M.D.   On: 11/29/2018 09:09   Dg Pelvis 1-2 Views  Result Date: 11/29/2018 CLINICAL DATA:  Fall EXAM: PELVIS - 1-2 VIEW COMPARISON:  None. FINDINGS: There is no evidence of pelvic fracture or diastasis. No pelvic bone lesions are seen. IMPRESSION: Negative. Electronically Signed   By: Marlan Palauharles  Clark M.D.   On: 11/29/2018 09:10   Ct Head Wo Contrast  Result Date: 11/29/2018 CLINICAL DATA:  [facility] Pt had unwitnessed fall. Pt is not on blood thinners, pt oriented at baseline (oriented to person at baseline). Hematoma to rt. Side of head EXAM: CT HEAD WITHOUT CONTRAST CT CERVICAL SPINE WITHOUT CONTRAST TECHNIQUE: Multidetector CT imaging of the  head and cervical spine was performed following the standard protocol without intravenous contrast. Multiplanar CT image reconstructions of the cervical spine were also generated. COMPARISON:  10/25/2018 FINDINGS: CT HEAD FINDINGS Brain: No evidence of acute infarction, hemorrhage, hydrocephalus, extra-axial collection or mass lesion/mass effect. There is age appropriate volume loss as well as patchy bilateral white matter hypoattenuation consistent with mild chronic microvascular ischemic change. Vascular: No hyperdense vessel or unexpected calcification. Skull: Normal. Negative for fracture or focal lesion. Sinuses/Orbits: Globes and orbits are unremarkable. The visualized sinuses and mastoid air cells are clear. Other: Right frontal/parietal scalp hematoma. CT  CERVICAL SPINE FINDINGS Alignment: Normal. Skull base and vertebrae: No acute fracture. No primary bone lesion or focal pathologic process. Soft tissues and spinal canal: No prevertebral fluid or swelling. No visible canal hematoma. Disc levels: Marked loss of disc height with spondylotic disc bulging and endplate spurring at C6-C7. Endplate spurring with minor spondylotic disc bulging noted from C3-C4 through C5-C6. No convincing disc herniation. Upper chest: No masses or adenopathy. No acute findings. Lung apices are clear. Other: None. IMPRESSION: HEAD CT 1. No acute intracranial abnormalities. Right frontoparietal scalp hematoma. No skull fracture. 2. Age-appropriate volume loss. Mild chronic microvascular ischemic change. CERVICAL CT 1. No fracture or acute finding. Electronically Signed   By: Amie Portland M.D.   On: 11/29/2018 09:03   Ct Cervical Spine Wo Contrast  Result Date: 11/29/2018 CLINICAL DATA:  Pt from [facility]. Pt had unwitnessed fall. Pt is not on blood thinners, pt oriented at baseline (oriented to person at baseline). Hematoma to rt. Side of head EXAM: CT HEAD WITHOUT CONTRAST CT CERVICAL SPINE WITHOUT CONTRAST TECHNIQUE:  Multidetector CT imaging of the head and cervical spine was performed following the standard protocol without intravenous contrast. Multiplanar CT image reconstructions of the cervical spine were also generated. COMPARISON:  10/25/2018 FINDINGS: CT HEAD FINDINGS Brain: No evidence of acute infarction, hemorrhage, hydrocephalus, extra-axial collection or mass lesion/mass effect. There is age appropriate volume loss as well as patchy bilateral white matter hypoattenuation consistent with mild chronic microvascular ischemic change. Vascular: No hyperdense vessel or unexpected calcification. Skull: Normal. Negative for fracture or focal lesion. Sinuses/Orbits: Globes and orbits are unremarkable. The visualized sinuses and mastoid air cells are clear. Other: Right frontal/parietal scalp hematoma. CT CERVICAL SPINE FINDINGS Alignment: Normal. Skull base and vertebrae: No acute fracture. No primary bone lesion or focal pathologic process. Soft tissues and spinal canal: No prevertebral fluid or swelling. No visible canal hematoma. Disc levels: Marked loss of disc height with spondylotic disc bulging and endplate spurring at C6-C7. Endplate spurring with minor spondylotic disc bulging noted from C3-C4 through C5-C6. No convincing disc herniation. Upper chest: No masses or adenopathy. No acute findings. Lung apices are clear. Other: None. IMPRESSION: HEAD CT 1. No acute intracranial abnormalities. Right frontoparietal scalp hematoma. No skull fracture. 2. Age-appropriate volume loss. Mild chronic microvascular ischemic change. CERVICAL CT 1. No fracture or acute finding. Electronically Signed   By: Amie Portland M.D.   On: 11/29/2018 09:03    Procedures Procedures (including critical care time)  Medications Ordered in ED Medications - No data to display   Initial Impression / Assessment and Plan / ED Course  I have reviewed the triage vital signs and the nursing notes.  Pertinent labs & imaging results that were  available during my care of the patient were reviewed by me and considered in my medical decision making (see chart for details).     Levi Russell is an 85 year old male with history of dementia who presents to the ED after a fall.  Patient with normal vitals.  No fever.  Patient with scalp hematoma over the right side of his head.  Patient is pleasant with no complaints currently.  Does not have any chest wall tenderness, no pelvis tenderness.  Able to move all his extremities without any issues.  Patient with no obvious neurological symptoms.  CT of the head and neck were ordered and were overall unremarkable.  Chest x-ray and pelvic x-ray also showed no acute findings.  Wound was cleaned and there is no  need for laceration repair.  Patient has mild abrasion over the top of his hematoma site over the right side of his head.  Recommend ice.  Discharged back to the facility in good condition.  This chart was dictated using voice recognition software.  Despite best efforts to proofread,  errors can occur which can change the documentation meaning.   Final Clinical Impressions(s) / ED Diagnoses   Final diagnoses:  Fall  Hematoma of scalp, initial encounter    ED Discharge Orders    None       Virgina NorfolkCuratolo, Briggette Najarian, DO 11/29/18 0920

## 2018-12-29 ENCOUNTER — Encounter (HOSPITAL_COMMUNITY): Payer: Self-pay | Admitting: Emergency Medicine

## 2018-12-29 ENCOUNTER — Emergency Department (HOSPITAL_COMMUNITY): Payer: Medicare Other

## 2018-12-29 ENCOUNTER — Inpatient Hospital Stay (HOSPITAL_COMMUNITY)
Admission: EM | Admit: 2018-12-29 | Discharge: 2019-01-03 | DRG: 871 | Disposition: A | Discharge status: Skilled Nursing Facility | Payer: Medicare Other | Attending: Oncology | Admitting: Oncology

## 2018-12-29 ENCOUNTER — Other Ambulatory Visit: Payer: Self-pay

## 2018-12-29 DIAGNOSIS — R42 Dizziness and giddiness: Secondary | ICD-10-CM | POA: Diagnosis not present

## 2018-12-29 DIAGNOSIS — E785 Hyperlipidemia, unspecified: Secondary | ICD-10-CM | POA: Diagnosis present

## 2018-12-29 DIAGNOSIS — E860 Dehydration: Secondary | ICD-10-CM | POA: Insufficient documentation

## 2018-12-29 DIAGNOSIS — M25561 Pain in right knee: Secondary | ICD-10-CM | POA: Diagnosis not present

## 2018-12-29 DIAGNOSIS — R531 Weakness: Secondary | ICD-10-CM

## 2018-12-29 DIAGNOSIS — M542 Cervicalgia: Secondary | ICD-10-CM | POA: Diagnosis not present

## 2018-12-29 DIAGNOSIS — F419 Anxiety disorder, unspecified: Secondary | ICD-10-CM | POA: Diagnosis not present

## 2018-12-29 DIAGNOSIS — M25562 Pain in left knee: Secondary | ICD-10-CM | POA: Diagnosis not present

## 2018-12-29 DIAGNOSIS — N390 Urinary tract infection, site not specified: Secondary | ICD-10-CM | POA: Diagnosis present

## 2018-12-29 DIAGNOSIS — R402122 Coma scale, eyes open, to pain, at arrival to emergency department: Secondary | ICD-10-CM | POA: Diagnosis present

## 2018-12-29 DIAGNOSIS — F0281 Dementia in other diseases classified elsewhere with behavioral disturbance: Secondary | ICD-10-CM

## 2018-12-29 DIAGNOSIS — R402352 Coma scale, best motor response, localizes pain, at arrival to emergency department: Secondary | ICD-10-CM | POA: Diagnosis present

## 2018-12-29 DIAGNOSIS — G309 Alzheimer's disease, unspecified: Secondary | ICD-10-CM

## 2018-12-29 DIAGNOSIS — N3000 Acute cystitis without hematuria: Secondary | ICD-10-CM | POA: Diagnosis present

## 2018-12-29 DIAGNOSIS — R402242 Coma scale, best verbal response, confused conversation, at arrival to emergency department: Secondary | ICD-10-CM | POA: Diagnosis present

## 2018-12-29 DIAGNOSIS — F02818 Dementia in other diseases classified elsewhere, unspecified severity, with other behavioral disturbance: Secondary | ICD-10-CM

## 2018-12-29 DIAGNOSIS — R238 Other skin changes: Secondary | ICD-10-CM | POA: Diagnosis not present

## 2018-12-29 DIAGNOSIS — I447 Left bundle-branch block, unspecified: Secondary | ICD-10-CM | POA: Diagnosis present

## 2018-12-29 DIAGNOSIS — I472 Ventricular tachycardia: Secondary | ICD-10-CM | POA: Diagnosis not present

## 2018-12-29 DIAGNOSIS — E119 Type 2 diabetes mellitus without complications: Secondary | ICD-10-CM | POA: Diagnosis present

## 2018-12-29 DIAGNOSIS — N179 Acute kidney failure, unspecified: Secondary | ICD-10-CM | POA: Diagnosis present

## 2018-12-29 DIAGNOSIS — I10 Essential (primary) hypertension: Secondary | ICD-10-CM | POA: Diagnosis present

## 2018-12-29 DIAGNOSIS — L8990 Pressure ulcer of unspecified site, unspecified stage: Secondary | ICD-10-CM

## 2018-12-29 DIAGNOSIS — R4182 Altered mental status, unspecified: Secondary | ICD-10-CM | POA: Diagnosis not present

## 2018-12-29 DIAGNOSIS — F0390 Unspecified dementia without behavioral disturbance: Secondary | ICD-10-CM | POA: Insufficient documentation

## 2018-12-29 DIAGNOSIS — Z7984 Long term (current) use of oral hypoglycemic drugs: Secondary | ICD-10-CM | POA: Diagnosis not present

## 2018-12-29 DIAGNOSIS — R509 Fever, unspecified: Secondary | ICD-10-CM

## 2018-12-29 DIAGNOSIS — E43 Unspecified severe protein-calorie malnutrition: Secondary | ICD-10-CM | POA: Insufficient documentation

## 2018-12-29 DIAGNOSIS — A4151 Sepsis due to Escherichia coli [E. coli]: Secondary | ICD-10-CM | POA: Diagnosis not present

## 2018-12-29 DIAGNOSIS — R451 Restlessness and agitation: Secondary | ICD-10-CM | POA: Diagnosis not present

## 2018-12-29 DIAGNOSIS — Z79899 Other long term (current) drug therapy: Secondary | ICD-10-CM

## 2018-12-29 DIAGNOSIS — R8271 Bacteriuria: Secondary | ICD-10-CM

## 2018-12-29 DIAGNOSIS — A419 Sepsis, unspecified organism: Secondary | ICD-10-CM | POA: Insufficient documentation

## 2018-12-29 DIAGNOSIS — B9620 Unspecified Escherichia coli [E. coli] as the cause of diseases classified elsewhere: Secondary | ICD-10-CM | POA: Diagnosis not present

## 2018-12-29 DIAGNOSIS — E876 Hypokalemia: Secondary | ICD-10-CM | POA: Diagnosis not present

## 2018-12-29 DIAGNOSIS — R7881 Bacteremia: Secondary | ICD-10-CM | POA: Diagnosis not present

## 2018-12-29 LAB — CBC WITH DIFFERENTIAL/PLATELET
Abs Immature Granulocytes: 0.06 10*3/uL (ref 0.00–0.07)
Basophils Absolute: 0 10*3/uL (ref 0.0–0.1)
Basophils Relative: 0 %
EOS ABS: 0.1 10*3/uL (ref 0.0–0.5)
EOS PCT: 1 %
HCT: 40.3 % (ref 39.0–52.0)
Hemoglobin: 13 g/dL (ref 13.0–17.0)
Immature Granulocytes: 1 %
Lymphocytes Relative: 4 %
Lymphs Abs: 0.5 10*3/uL — ABNORMAL LOW (ref 0.7–4.0)
MCH: 29.4 pg (ref 26.0–34.0)
MCHC: 32.3 g/dL (ref 30.0–36.0)
MCV: 91.2 fL (ref 80.0–100.0)
Monocytes Absolute: 1 10*3/uL (ref 0.1–1.0)
Monocytes Relative: 9 %
Neutro Abs: 9.3 10*3/uL — ABNORMAL HIGH (ref 1.7–7.7)
Neutrophils Relative %: 85 %
Platelets: 203 10*3/uL (ref 150–400)
RBC: 4.42 MIL/uL (ref 4.22–5.81)
RDW: 13.2 % (ref 11.5–15.5)
WBC: 11 10*3/uL — ABNORMAL HIGH (ref 4.0–10.5)
nRBC: 0 % (ref 0.0–0.2)

## 2018-12-29 LAB — BLOOD CULTURE ID PANEL (REFLEXED)
Acinetobacter baumannii: NOT DETECTED
CANDIDA KRUSEI: NOT DETECTED
Candida albicans: NOT DETECTED
Candida glabrata: NOT DETECTED
Candida parapsilosis: NOT DETECTED
Candida tropicalis: NOT DETECTED
Carbapenem resistance: NOT DETECTED
ENTEROCOCCUS SPECIES: NOT DETECTED
Enterobacter cloacae complex: NOT DETECTED
Enterobacteriaceae species: DETECTED — AB
Escherichia coli: DETECTED — AB
Haemophilus influenzae: NOT DETECTED
Klebsiella oxytoca: NOT DETECTED
Klebsiella pneumoniae: NOT DETECTED
LISTERIA MONOCYTOGENES: NOT DETECTED
Neisseria meningitidis: NOT DETECTED
Proteus species: NOT DETECTED
Pseudomonas aeruginosa: NOT DETECTED
STREPTOCOCCUS PNEUMONIAE: NOT DETECTED
Serratia marcescens: NOT DETECTED
Staphylococcus aureus (BCID): NOT DETECTED
Staphylococcus species: NOT DETECTED
Streptococcus agalactiae: NOT DETECTED
Streptococcus pyogenes: NOT DETECTED
Streptococcus species: NOT DETECTED

## 2018-12-29 LAB — URINALYSIS, ROUTINE W REFLEX MICROSCOPIC
Bilirubin Urine: NEGATIVE
Glucose, UA: NEGATIVE mg/dL
Ketones, ur: NEGATIVE mg/dL
Nitrite: POSITIVE — AB
Protein, ur: 30 mg/dL — AB
Specific Gravity, Urine: 1.017 (ref 1.005–1.030)
WBC, UA: >50 WBC/hpf — ABNORMAL HIGH (ref 0–5)
pH: 5 (ref 5.0–8.0)

## 2018-12-29 LAB — CBC
HCT: 39.7 % (ref 39.0–52.0)
Hemoglobin: 12.2 g/dL — ABNORMAL LOW (ref 13.0–17.0)
MCH: 28.5 pg (ref 26.0–34.0)
MCHC: 30.7 g/dL (ref 30.0–36.0)
MCV: 92.8 fL (ref 80.0–100.0)
Platelets: 172 10*3/uL (ref 150–400)
RBC: 4.28 MIL/uL (ref 4.22–5.81)
RDW: 13.2 % (ref 11.5–15.5)
WBC: 9.8 10*3/uL (ref 4.0–10.5)
nRBC: 0 % (ref 0.0–0.2)

## 2018-12-29 LAB — COMPREHENSIVE METABOLIC PANEL
ALT: 23 U/L (ref 0–44)
AST: 41 U/L (ref 15–41)
Albumin: 3.2 g/dL — ABNORMAL LOW (ref 3.5–5.0)
Alkaline Phosphatase: 39 U/L (ref 38–126)
Anion gap: 14 (ref 5–15)
BUN: 28 mg/dL — ABNORMAL HIGH (ref 8–23)
CHLORIDE: 101 mmol/L (ref 98–111)
CO2: 27 mmol/L (ref 22–32)
Calcium: 9.1 mg/dL (ref 8.9–10.3)
Creatinine, Ser: 1.27 mg/dL — ABNORMAL HIGH (ref 0.61–1.24)
GFR calc non Af Amer: 52 mL/min — ABNORMAL LOW (ref >60)
Glucose, Bld: 132 mg/dL — ABNORMAL HIGH (ref 70–99)
Potassium: 4.1 mmol/L (ref 3.5–5.1)
Sodium: 142 mmol/L (ref 135–145)
Total Bilirubin: 1.5 mg/dL — ABNORMAL HIGH (ref 0.3–1.2)
Total Protein: 6.7 g/dL (ref 6.5–8.1)

## 2018-12-29 LAB — CBG MONITORING, ED
Glucose-Capillary: 120 mg/dL — ABNORMAL HIGH (ref 70–99)
Glucose-Capillary: 99 mg/dL (ref 70–99)

## 2018-12-29 LAB — CREATININE, SERUM
Creatinine, Ser: 1.08 mg/dL (ref 0.61–1.24)
GFR calc Af Amer: >60 mL/min (ref >60)
GFR calc non Af Amer: >60 mL/min (ref >60)

## 2018-12-29 LAB — INFLUENZA PANEL BY PCR (TYPE A & B)
Influenza A By PCR: NEGATIVE
Influenza B By PCR: NEGATIVE

## 2018-12-29 LAB — LACTIC ACID, PLASMA
LACTIC ACID, VENOUS: 3.1 mmol/L — AB (ref 0.5–1.9)
Lactic Acid, Venous: 2.1 mmol/L (ref 0.5–1.9)
Lactic Acid, Venous: 1.4 mmol/L (ref 0.5–1.9)

## 2018-12-29 LAB — GLUCOSE, CAPILLARY
GLUCOSE-CAPILLARY: 99 mg/dL (ref 70–99)
Glucose-Capillary: 77 mg/dL (ref 70–99)
Glucose-Capillary: 97 mg/dL (ref 70–99)

## 2018-12-29 LAB — MRSA PCR SCREENING: MRSA BY PCR: NEGATIVE

## 2018-12-29 MED ORDER — SODIUM CHLORIDE 0.9 % IV SOLN: 2.0000 g | INTRAVENOUS | Status: DC

## 2018-12-29 MED ORDER — SODIUM CHLORIDE 0.9 % IV SOLN
2.0000 g | Freq: Once | INTRAVENOUS | Status: AC
  Administered 2018-12-29: 2 g via INTRAVENOUS
  Filled 2018-12-29: qty 2

## 2018-12-29 MED ORDER — ACETAMINOPHEN 325 MG PO TABS
650.0000 mg | ORAL_TABLET | Freq: Four times a day (QID) | ORAL | Status: DC | PRN
  Administered 2018-12-30 – 2019-01-03 (×4): 650 mg via ORAL
  Filled 2018-12-29 (×4): qty 2

## 2018-12-29 MED ORDER — LACTATED RINGERS IV SOLN
INTRAVENOUS | Status: AC
  Administered 2018-12-29 – 2018-12-30 (×2): via INTRAVENOUS

## 2018-12-29 MED ORDER — SODIUM CHLORIDE 0.9 % IV SOLN
2.0000 g | INTRAVENOUS | Status: DC
  Administered 2018-12-30 – 2019-01-03 (×5): 2 g via INTRAVENOUS
  Filled 2018-12-29 (×5): qty 20

## 2018-12-29 MED ORDER — SENNOSIDES-DOCUSATE SODIUM 8.6-50 MG PO TABS: 1.0000 | ORAL_TABLET | Freq: Every evening | ORAL | Status: DC | PRN

## 2018-12-29 MED ORDER — SODIUM CHLORIDE 0.9 % IV BOLUS (SEPSIS)
1000.0000 mL | Freq: Once | INTRAVENOUS | Status: AC
  Administered 2018-12-29: 1000 mL via INTRAVENOUS

## 2018-12-29 MED ORDER — METRONIDAZOLE IN NACL 5-0.79 MG/ML-% IV SOLN
500.0000 mg | Freq: Three times a day (TID) | INTRAVENOUS | Status: DC
  Administered 2018-12-29: 500 mg via INTRAVENOUS
  Filled 2018-12-29: qty 100

## 2018-12-29 MED ORDER — ACETAMINOPHEN 650 MG RE SUPP
650.0000 mg | Freq: Four times a day (QID) | RECTAL | Status: DC | PRN
  Administered 2018-12-29: 650 mg via RECTAL
  Filled 2018-12-29: qty 1

## 2018-12-29 MED ORDER — INSULIN ASPART 100 UNIT/ML ~~LOC~~ SOLN
0.0000 [IU] | SUBCUTANEOUS | Status: DC
  Administered 2019-01-01: 1 [IU] via SUBCUTANEOUS

## 2018-12-29 MED ORDER — SODIUM CHLORIDE 0.9% FLUSH
3.0000 mL | Freq: Two times a day (BID) | INTRAVENOUS | Status: DC
  Administered 2018-12-29 – 2019-01-02 (×6): 3 mL via INTRAVENOUS

## 2018-12-29 MED ORDER — VANCOMYCIN HCL 10 G IV SOLR
1250.0000 mg | INTRAVENOUS | Status: AC
  Administered 2018-12-29: 1250 mg via INTRAVENOUS
  Filled 2018-12-29: qty 1250

## 2018-12-29 MED ORDER — VANCOMYCIN HCL 10 G IV SOLR: 1250.0000 mg | INTRAVENOUS | Status: DC

## 2018-12-29 MED ORDER — SODIUM CHLORIDE 0.9% FLUSH: 3.0000 mL | INTRAVENOUS | Status: DC | PRN

## 2018-12-29 MED ORDER — ACETAMINOPHEN 650 MG RE SUPP
650.0000 mg | Freq: Four times a day (QID) | RECTAL | Status: DC | PRN
  Administered 2018-12-29 – 2018-12-30 (×3): 650 mg via RECTAL
  Filled 2018-12-29 (×3): qty 1

## 2018-12-29 MED ORDER — SODIUM CHLORIDE 0.9 % IV SOLN: 250.0000 mL | INTRAVENOUS | Status: DC | PRN

## 2018-12-29 MED ORDER — ENOXAPARIN SODIUM 40 MG/0.4ML ~~LOC~~ SOLN
40.0000 mg | SUBCUTANEOUS | Status: DC
  Administered 2018-12-29 – 2019-01-03 (×6): 40 mg via SUBCUTANEOUS
  Filled 2018-12-29 (×6): qty 0.4

## 2018-12-29 MED ORDER — VANCOMYCIN HCL IN DEXTROSE 1-5 GM/200ML-% IV SOLN
1000.0000 mg | Freq: Once | INTRAVENOUS | Status: DC
  Filled 2018-12-29: qty 200

## 2018-12-29 NOTE — H&P (Addendum)
Date: 12/29/2018               Patient Name:  Levi Russell MRN: [id number]  DOB: October 16, 1933 Age / Sex: 85 y.o., male   PCP: Patient, No Pcp Per         Medical Service: Internal Medicine Teaching Service         Attending Physician: Dr. Cephas Darby, MD    First Contact: Dr. Judeth Cornfield Pager: [phone]  Second Contact: Dr. Ginette Otto Pager: [phone]       After Hours (After 5p/  First Contact Pager: [phone]  weekends / holidays): Second Contact Pager: [phone]   Chief Complaint: Weakness  History of Present Illness:  Levi Russell is a 85 yo M w/ PMH of T2DM, HLD, HTN and dementia presenting from his nursing home [facility]) after being found altered per nursing staff. He was examined and evaluated at ED bedside. He is lethargic but awakens to command. He is able to follow some directions but mostly somnolent. Unable to describe history or express any complaints.  Spoke with nursing staff [Jasmine] at [facility] [phone]) who states that he was in his usual state of health until early this morning when he was found to be unresponsive and more difficult to awaken than usual. Describes no obvious precipitating event. States at baseline, able to converse and answer questions but disoriented and sleeps mostly throughout the day. Able to perform some of ADLs but not IADLS due to dementia. Walks with walker.   In the ED, he was found to be febrile (101.7) with GCS 11 with lethargy and confusion. His UA showed positive nitrite and leukocytes with bacteriuria. Leukocytosis at 11 with elevated lactate. Blood and urine cultures were collected and he was started on vanc, cefepime and flagy after 2L NS bolus.  Meds: Current Meds  Medication Sig  . clotrimazole (LOTRIMIN) 1 % cream Apply 1 application topically 2 (two) times daily.  Marland Kitchen LORazepam (ATIVAN) 0.5 MG tablet Take 0.5 mg by mouth every 6 (six) hours as needed for anxiety.  . metFORMIN (GLUCOPHAGE) 1000 MG tablet Take  1,000 mg by mouth 2 (two) times daily with a meal.  . risperiDONE (RISPERDAL) 0.5 MG tablet Take 0.5 mg by mouth 2 (two) times daily at 10 am and 4 pm.  . risperiDONE (RISPERDAL) 1 MG tablet Take 0.5 mg by mouth every 4 (four) hours as needed (psychosis).  . traZODone (DESYREL) 50 MG tablet Take 50 mg by mouth at bedtime as needed for sleep.  . vitamin B-12 (CYANOCOBALAMIN) 500 MCG tablet Take 500 mcg by mouth daily.   Allergies: Allergies as of 12/29/2018  . (No Known Allergies)   Past Medical History:  Diagnosis Date  . B12 deficiency anemia   . Dementia (HCC)   . Diabetes mellitus without complication (HCC)    Type 2  . Foot drop   . Hyperlipidemia   . Hypertension    Family History:  Unable to provide family history  Social History:  Unable to provide social history but came from [facility]  Review of Systems: A complete ROS was negative except as per HPI.   Physical Exam: Blood pressure 134/71, pulse 83, temperature (!) 100.7 F (38.2 C), temperature source Rectal, resp. rate 18, SpO2 100 %.  Gen: Chronically ill-appearing, cachetic, somnolent HEENT: + temporal wasting, dry mucous membrane, PERRL CV: RRR, S1, S2 normal, No rubs, no murmurs, no gallops Pulm: CTAB, No rales, no wheezes Abd: Soft, BS+, suprapubic tenderness  to palpation Extm: ROM intact, Peripheral pulses intact, Edematous left hand and arm without warmth or erythem or tenderness Skin: Dry, Warm, normal turgor, no obvious sacral ulcers Neuro: GCS 13 with confusion, unable to perform neuro exam due to somnolence  EKG: personally reviewed my interpretation is normal sinus, multiple PVCs, known left bundle branch block. No significant changes from prior.  CXR: personally reviewed my interpretation is rotated film, no lobar consolidation, no vascular congestion, no pleural effusion.  Assessment & Plan by Problem: Active Problems:   * No active hospital problems. *  Levi Russell is a 85 yo M w/ PMH of  T2DM, HLD, HTN and dementia presenting from his nursing home with altered mental status found to have UTI. He will be admitted for inpatient stay with IV empiric antibiotic therapy and fluid resuscitation and we will follow the urine culture and blood culture for adjustments as needed.  Altered mental status 2/2 UTI 101.28F on admission. BP stable. GCS 13. WBC 11.0 UA + leuko est, nitrates, bacteria. Started on vanc, zosyn, cefepime in ED. Lactate 2.1->3.1 - D/c zosyn, vanc, cefepime - Start Ceftriaxone per pharmacy - F/u Urine Culture, Blood culture - Trend Lactate til WNL  Hx of Dementia At home on lorazepam q6hr PRN, Risperdal 0.5mg  BID and Trazodone 50mg  qhs - Holding centrally acting meds in AMS  Acute Kidney Injury 2/2 dehydration Baseline creatinine 0.81. Admit creatinine 1.27. Status post 2L NS bolus in ED - Trend BMP - Avoid nephrotoxic meds  T2DM On metformin 1000mg  BID at home - SSI - Glucose checks  DVT prophx: Lovenox Diet: Npo til more alert Code: Full (PER FACE SHEET FROM [name])  Dispo: Admit patient to Inpatient with expected length of stay greater than 2 midnights.  Signed: Theotis Barrio, MD 12/29/2018, 10:40 AM  Pager: [phone]

## 2018-12-29 NOTE — Progress Notes (Signed)
Patient has temp of 101.4.  Tylenol supp given at 1841.  Notified MD .  Ordered to give tylenol early per Dr. Maryla Morrow . Will continue to monitor the patient and notify MD as needed

## 2018-12-29 NOTE — Progress Notes (Signed)
Patient admitted to 5W21. He responds to voice and knows who he is and that he is in the hospital. He also follows commands. Temp 101.7 on admission, PR tylenol given. No family at bedside. Multiple skin issues found on admission, see flowsheets. Will continue to monitor.

## 2018-12-29 NOTE — ED Notes (Signed)
Condom cath placed.

## 2018-12-29 NOTE — ED Provider Notes (Signed)
[facility] EMERGENCY DEPARTMENT Provider Note   CSN: [id number] Arrival date & time: 12/29/18  4163     History   Chief Complaint Chief Complaint  Patient presents with  . Weakness    HPI Levi Russell is a 85 y.o. male.  HPI Patient presents to the emergency room for evaluation of altered mental status and fever.  At baseline patient is able to walk around and does speak but is confused and really is only aware of his name.  Patient is a resident of a nursing home.  They noticed that starting yesterday he was less active.  This morning when they found him and the patient was minimally responsive.  His skin was very warm to the touch.  He was sent to the ED for further evaluation.  No known coughing.  No known vomiting or diarrhea.  The patient does respond during exam here in the emergency room (he asks what we are doing to him)  but otherwise does not provide any history Past Medical History:  Diagnosis Date  . B12 deficiency anemia   . Dementia (HCC)   . Diabetes mellitus without complication (HCC)    Type 2  . Foot drop   . Hyperlipidemia   . Hypertension     There are no active problems to display for this patient.   History reviewed. No pertinent surgical history.      Home Medications    Prior to Admission medications   Medication Sig Start Date End Date Taking? Authorizing Provider  clotrimazole (LOTRIMIN) 1 % cream Apply 1 application topically 2 (two) times daily.    [provider]  LORazepam (ATIVAN) 0.5 MG tablet Take 0.5 mg by mouth every 6 (six) hours as needed for anxiety.    [provider]  metFORMIN (GLUCOPHAGE) 1000 MG tablet Take 1,000 mg by mouth 2 (two) times daily with a meal.    [provider]  risperiDONE (RISPERDAL) 0.5 MG tablet Take 0.5 mg by mouth 2 (two) times daily at 10 am and 4 pm.    [provider]  risperiDONE (RISPERDAL) 1 MG tablet Take 0.5 mg by mouth every 4 (four) hours as  needed (psychosis).    [provider]  traZODone (DESYREL) 50 MG tablet Take 50 mg by mouth at bedtime as needed for sleep.    [provider]  vitamin B-12 (CYANOCOBALAMIN) 500 MCG tablet Take 500 mcg by mouth daily.    [provider]    Family History History reviewed. No pertinent family history.  Social History Social History   Tobacco Use  . Smoking status: Former Games developer  . Smokeless tobacco: Never Used  Substance Use Topics  . Alcohol use: Not on file  . Drug use: Not on file     Allergies   Patient has no known allergies.   Review of Systems Review of Systems  All other systems reviewed and are negative.    Physical Exam Updated Vital Signs BP (!) 143/67   Pulse 90   Temp (!) 101.7 F (38.7 C) (Rectal)   Resp 19   SpO2 98%   Physical Exam Vitals signs and nursing note reviewed.  Constitutional:      General: He is not in acute distress.    Appearance: He is well-developed. He is ill-appearing.  HENT:     Head: Normocephalic and atraumatic.     Right Ear: External ear normal.     Left Ear: External ear normal.  Eyes:     General: No scleral icterus.       Right eye: No discharge.        Left eye: No discharge.     Conjunctiva/sclera: Conjunctivae normal.  Neck:     Musculoskeletal: Neck supple.     Trachea: No tracheal deviation.  Cardiovascular:     Rate and Rhythm: Normal rate and regular rhythm.  Pulmonary:     Effort: Pulmonary effort is normal. Tachypnea present. No respiratory distress.     Breath sounds: Normal breath sounds. No stridor. No wheezing or rales.  Abdominal:     General: Bowel sounds are normal. There is no distension.     Palpations: Abdomen is soft.     Tenderness: There is no abdominal tenderness. There is no guarding or rebound.  Musculoskeletal:        General: No tenderness.  Skin:    General: Skin is warm and dry.     Findings: No rash.  Neurological:     Mental Status: He is  lethargic.     GCS: GCS eye subscore is 2. GCS verbal subscore is 4. GCS motor subscore is 5.     Cranial Nerves: No cranial nerve deficit (no facial droop, extraocular movements intact, no slurred speech).     Sensory: No sensory deficit.     Motor: Atrophy present. No abnormal muscle tone or seizure activity.     Coordination: Coordination normal.     Comments: Patient asked questions while blood was being drawn and IV was patient responded to to painful intervention such as IV insertion and blood draw, otherwise is not answering any questions, does not follow commands      ED Treatments / Results  Labs (all labs ordered are listed, but only abnormal results are displayed) Labs Reviewed  LACTIC ACID, PLASMA - Abnormal; Notable for the following components:      Result Value   Lactic Acid, Venous 2.1 (*)    All other components within normal limits  COMPREHENSIVE METABOLIC PANEL - Abnormal; Notable for the following components:   Glucose, Bld 132 (*)    BUN 28 (*)    Creatinine, Ser 1.27 (*)    Albumin 3.2 (*)    Total Bilirubin 1.5 (*)    GFR calc non Af Amer 52 (*)    All other components within normal limits  CBC WITH DIFFERENTIAL/PLATELET - Abnormal; Notable for the following components:   WBC 11.0 (*)    Neutro Abs 9.3 (*)    Lymphs Abs 0.5 (*)    All other components within normal limits  URINALYSIS, ROUTINE W REFLEX MICROSCOPIC - Abnormal; Notable for the following components:   APPearance HAZY (*)    Hgb urine dipstick LARGE (*)    Protein, ur 30 (*)    Nitrite POSITIVE (*)    Leukocytes, UA MODERATE (*)    WBC, UA >50 (*)    Bacteria, UA MANY (*)    All other components within normal limits  CBG MONITORING, ED - Abnormal; Notable for the following components:   Glucose-Capillary 120 (*)    All other components within normal limits  CULTURE, BLOOD (ROUTINE X 2)  CULTURE, BLOOD (ROUTINE X 2)  URINE CULTURE  INFLUENZA PANEL BY PCR (TYPE A & B)  LACTIC ACID,  PLASMA    EKG EKG Interpretation  Date/Time:  Saturday December 29 2018 07:57:15 EST Ventricular Rate:  81 PR Interval:    QRS Duration: 145 QT Interval:  359  QTC Calculation: 417 R Axis:   47 Text Interpretation:  Sinus tachycardia Multiform ventricular premature complexes Left bundle branch block Since last tracing rate faster Confirmed by Linwood DibblesKnapp, Malachi Suderman [phone]([id number]) on 12/29/2018 8:14:53 AM   Radiology Dg Chest Port 1 View  Result Date: 12/29/2018 CLINICAL DATA:  Fever today dementia hypertension EXAM: PORTABLE CHEST 1 VIEW COMPARISON:  11/29/2018 FINDINGS: Shallow lung inflation. Patient is slightly rotated. Heart size is normal. The lungs are free of focal consolidations and pleural effusions. No pulmonary edema. Skin fold overlies the LEFT hemithorax. Degenerative changes are seen in the thoracic spine. IMPRESSION: No evidence for acute cardiopulmonary abnormality. Electronically Signed   By: Norva PavlovElizabeth  Brown M.D.   On: 12/29/2018 08:31    Procedures .Critical Care Performed by: Linwood DibblesKnapp, Kamren Heintzelman, MD Authorized by: Linwood DibblesKnapp, Anneliese Leblond, MD   Critical care provider statement:    Critical care time (minutes):  30   Critical care was time spent personally by me on the following activities:  Discussions with consultants, evaluation of patient's response to treatment, examination of patient, ordering and performing treatments and interventions, ordering and review of laboratory studies, ordering and review of radiographic studies, pulse oximetry, re-evaluation of patient's condition, obtaining history from patient or surrogate and review of old charts   (including critical care time)  Medications Ordered in ED Medications  metroNIDAZOLE (FLAGYL) IVPB 500 mg (0 mg Intravenous Stopped 12/29/18 0953)  vancomycin (VANCOCIN) 1,250 mg in sodium chloride 0.9 % 250 mL IVPB (1,250 mg Intravenous New Bag/Given 12/29/18 0926)  acetaminophen (TYLENOL) suppository 650 mg (650 mg Rectal Given 12/29/18 0922)  vancomycin  (VANCOCIN) 1,250 mg in sodium chloride 0.9 % 250 mL IVPB (has no administration in time range)  ceFEPIme (MAXIPIME) 2 g in sodium chloride 0.9 % 100 mL IVPB (has no administration in time range)  sodium chloride 0.9 % bolus 1,000 mL (0 mLs Intravenous Stopped 12/29/18 0918)    And  sodium chloride 0.9 % bolus 1,000 mL (1,000 mLs Intravenous New Bag/Given 12/29/18 0829)  ceFEPIme (MAXIPIME) 2 g in sodium chloride 0.9 % 100 mL IVPB (0 g Intravenous Stopped 12/29/18 0918)     Initial Impression / Assessment and Plan / ED Course  I have reviewed the triage vital signs and the nursing notes.  Pertinent labs & imaging results that were available during my care of the patient were reviewed by me and considered in my medical decision making (see chart for details).  Clinical Course as of Dec 29 1000  Sat Dec 29, 2018  [id number] Laboratory tests reviewed.  Leukocytosis acute kidney injury noted.  Mild increase in bilirubin.  Lactic acid is above 2.1 and urinalysis is consistent with urinary tract infection.  No signs of severe sepsis at this time patient is normotensive.   [JK]    Clinical Course User Index [JK] Linwood DibblesKnapp, Edy Belt, MD    Patient presented to the emergency room for evaluation of fever and a decline in his mental status.  In the ED the patient was primarily responsive to painful stimuli.  His vital signs did not show any evidence of hypotension or severe tachycardia.  However he was confused and his lactic acid level was elevated suggesting a component of early sepsis.  Patient was started on empiric IV antibiotics.  His urinalysis is consistent with a urinary tract infection.  Plan on admission to the hospital for further treatment and evaluation. Final Clinical Impressions(s) / ED Diagnoses   Final diagnoses:  Acute cystitis without hematuria  Sepsis, due to unspecified  organism, unspecified whether acute organ dysfunction present [facility])       Linwood Dibbles, MD 12/29/18 1005

## 2018-12-29 NOTE — ED Triage Notes (Signed)
Pt arrives to ED from [facility] with complaints of weakness starting yesterday. No last known normal known by nursing home staff. Pt is usually alert to self and can get around on a walker.

## 2018-12-29 NOTE — Progress Notes (Signed)
Pharmacy Antibiotic Note  Levi Russell is a 85 y.o. male admitted on 12/29/2018 with sepsis.  Pharmacy has been consulted for Vanco/Cefepime dosing.  CC/HPI: Weakness from NH, AMS, fever - Previous 63.5kg  PMH: dementia, Fall at [facility] 1/20. B12 def, DM, foot drop, HTN, HLD  ID: Temp 101.7. LA 2.1. WBC 11 Vanco 2/8>> Cefepime 2/8>> Flagyl 2/8>>  Vancomycin 1250mg  IV Q 36 hrs. Goal AUC 400-550. Expected AUC: 472 SCr used: 1.27   Plan: Vanco 1250mg  IV x 1 in ED, then 1250mg  IV q 36 hrs Cefepime 2g IV x 1, then every 24 hrs Flagyl 500mg  IV q 8 hrs     Temp (24hrs), Avg:101.5 F (38.6 C), Min:101.2 F (38.4 C), Max:101.7 F (38.7 C)  Recent Labs  Lab 12/29/18 0804  WBC 11.0*  CREATININE 1.27*  LATICACIDVEN 2.1*    CrCl cannot be calculated (Unknown ideal weight.).    No Known Allergies   Ishani Goldwasser S. Merilynn Finland, PharmD, BCPS Clinical Staff Pharmacist  Misty Stanley Stillinger 12/29/2018 9:58 AM

## 2018-12-30 DIAGNOSIS — B9620 Unspecified Escherichia coli [E. coli] as the cause of diseases classified elsewhere: Secondary | ICD-10-CM

## 2018-12-30 DIAGNOSIS — R238 Other skin changes: Secondary | ICD-10-CM

## 2018-12-30 DIAGNOSIS — R7881 Bacteremia: Secondary | ICD-10-CM

## 2018-12-30 LAB — BASIC METABOLIC PANEL
Anion gap: 11 (ref 5–15)
BUN: 25 mg/dL — ABNORMAL HIGH (ref 8–23)
CO2: 25 mmol/L (ref 22–32)
Calcium: 8.2 mg/dL — ABNORMAL LOW (ref 8.9–10.3)
Chloride: 108 mmol/L (ref 98–111)
Creatinine, Ser: 1.17 mg/dL (ref 0.61–1.24)
GFR calc Af Amer: >60 mL/min (ref >60)
GFR calc non Af Amer: 57 mL/min — ABNORMAL LOW (ref >60)
Glucose, Bld: 98 mg/dL (ref 70–99)
POTASSIUM: 3.9 mmol/L (ref 3.5–5.1)
Sodium: 144 mmol/L (ref 135–145)

## 2018-12-30 LAB — GLUCOSE, CAPILLARY
GLUCOSE-CAPILLARY: 93 mg/dL (ref 70–99)
Glucose-Capillary: 105 mg/dL — ABNORMAL HIGH (ref 70–99)
Glucose-Capillary: 107 mg/dL — ABNORMAL HIGH (ref 70–99)
Glucose-Capillary: 82 mg/dL (ref 70–99)
Glucose-Capillary: 86 mg/dL (ref 70–99)
Glucose-Capillary: 91 mg/dL (ref 70–99)

## 2018-12-30 LAB — CBC
HEMATOCRIT: 36.9 % — AB (ref 39.0–52.0)
Hemoglobin: 11.8 g/dL — ABNORMAL LOW (ref 13.0–17.0)
MCH: 28.9 pg (ref 26.0–34.0)
MCHC: 32 g/dL (ref 30.0–36.0)
MCV: 90.2 fL (ref 80.0–100.0)
Platelets: 152 10*3/uL (ref 150–400)
RBC: 4.09 MIL/uL — ABNORMAL LOW (ref 4.22–5.81)
RDW: 13.2 % (ref 11.5–15.5)
WBC: 7 10*3/uL (ref 4.0–10.5)
nRBC: 0 % (ref 0.0–0.2)

## 2018-12-30 MED ORDER — CHLORHEXIDINE GLUCONATE 0.12 % MT SOLN
15.0000 mL | Freq: Two times a day (BID) | OROMUCOSAL | Status: DC
  Administered 2018-12-30 – 2019-01-02 (×7): 15 mL via OROMUCOSAL
  Filled 2018-12-30 (×7): qty 15

## 2018-12-30 MED ORDER — ORAL CARE MOUTH RINSE
15.0000 mL | Freq: Two times a day (BID) | OROMUCOSAL | Status: DC
  Administered 2018-12-30 – 2019-01-02 (×7): 15 mL via OROMUCOSAL

## 2018-12-30 NOTE — Progress Notes (Signed)
   Subjective:  Levi Russell is a 85 y.o. with PMH of T2DM, HLD, HTN and dementia admit for E.Coli Bacteremia on hospital day 1  Levi Russell was examined and evaluated at bedside this AM. He is somnolent but arousal. Able to follow verbal command but not fully cooperative. Unable to describe his location. Unable to answer any ROS questions.  Objective:  Vital signs in last 24 hours: Vitals:   12/29/18 2123 12/30/18 0032 12/30/18 0549 12/30/18 0800  BP: 139/72  137/68   Pulse: 75  68   Resp: 20  18   Temp: (!) 101.4 F (38.6 C) 98 F (36.7 C) (!) 100.6 F (38.1 C) 99.1 F (37.3 C)  TempSrc: Oral Oral Oral Axillary  SpO2: 95%  97%   Weight:      Height:       Physical Exam  Constitutional:  Cachetic  Eyes: Conjunctivae are normal.  Superficial fluid-filled bulla on left eyelid without surrounding erythema, warmth or tenderness  Neck: Normal range of motion. Neck supple.  Cardiovascular: Normal rate, regular rhythm, normal heart sounds and intact distal pulses.  No murmur heard. Pulmonary/Chest: Effort normal and breath sounds normal. He has no wheezes. He has no rales.  Abdominal: Soft. Bowel sounds are normal. There is no abdominal tenderness. There is no guarding.  Musculoskeletal: Normal range of motion.        General: No edema.  Neurological:  Somnolent, GCS 12  Skin: Skin is warm and dry. No rash noted.   Assessment/Plan:  Active Problems:   Complicated UTI (urinary tract infection)   Pressure injury of skin  Levi Russell is a 85 yo M w/ PMH of T2DM, HLD, HTN and dementia presenting from his nursing home with altered mental status found to have UTI. He continues to be febrile but his lactate trended down and leukocytosis has resolved. Currently have stable vitals.   E.coli bacteremia most likely 2/2 urinary source Spiked Fever overnight up to 101.4.  Culture growing gram negative rods. BC biofire showing E.Coli. UA + leuko est, nitrates, bacteria.  WBC 11.0->9.8->7.0.  Continues to be somnolent. - C/w Ceftriaxone 2g daily - F/u Urine Culture, Blood culture  Acute Kidney Injury 2/2 dehydration Baseline creatinine 0.81. Admit creatinine 1.2->1.08 - Resolved - C/w LR 100cc/hr - Currently Npo will get speech eval to see if he can replete orally without aspiration event  DVT prophx: Lovenox Diet: Npo til more alert Code: Full  Dispo: Anticipated discharge in approximately 1-2 day(s).   Levi Barrio, MD 12/30/2018, 9:31 AM Pager: [phone]

## 2018-12-30 NOTE — Evaluation (Signed)
Clinical/Bedside Swallow Evaluation Patient Details  Name: Levi Russell MRN: [id number] Date of Birth: May 19, 1933  Today's Date: 12/30/2018 Time: SLP Start Time (ACUTE ONLY): 1416 SLP Stop Time (ACUTE ONLY): 1439 SLP Time Calculation (min) (ACUTE ONLY): 23 min  Past Medical History:  Past Medical History:  Diagnosis Date  . B12 deficiency anemia   . Dementia (HCC)   . Diabetes mellitus without complication (HCC)    Type 2  . Foot drop   . Hyperlipidemia   . Hypertension    Past Surgical History: History reviewed. No pertinent surgical history. HPI:  Levi Russell is a 85 yo M w/ PMH of T2DM, HLD, HTN and dementia presenting from his nursing home after being found altered with decreased responsiveness. Found to have UTI. CXR No evidence for acute cardiopulmonary abnormality.   Assessment / Plan / Recommendation Clinical Impression  Levi Russell is cachectic, significantly contracted (legs contracted close to 90 degrees), HOH with decreased comprehension and understanding of SLP's purpose due to dementia. He was not cooperative with oral-motor exam and prefers to keep eyes closed. Initially pt unable to motor plan/execute/recognize request to "drink from straw" from verbal and tactile cues. SLP used straw as a pipette to administer small amount of requested orange juice moving to spoon and eventual straw presentations of consecutive sips. No cough/throat clear or wet vocal quality present throughout extended assessment. Exhibiting expected dementia-like acceptance of small amounts of puree eventually opening oral cavity wider for spoon presentations. He completed 4 oz cup applesauce with unremarkable pharyngeal phase. Not appropriate to attempt higher solid textures. Recommend repositioning as close as able to upright, puree texture, thin liquids, observe throat for swallow before subsequent bites and slow rate. No further ST recommended at this time as suspected he is close to baseline status.  SLP  Visit Diagnosis: Dysphagia, oral phase (R13.11)    Aspiration Risk  Moderate aspiration risk    Diet Recommendation Dysphagia 1 (Puree);Thin liquid   Liquid Administration via: Straw Medication Administration: Crushed with puree Supervision: Staff to assist with self feeding;Full supervision/cueing for compensatory strategies Compensations: Minimize environmental distractions;Slow rate;Small sips/bites;Lingual sweep for clearance of pocketing Postural Changes: Seated upright at 90 degrees    Other  Recommendations Oral Care Recommendations: Oral care BID   Follow up Recommendations Other (comment)(possibly none)      Frequency and Duration min 1 x/week  2 weeks       Prognosis Prognosis for Safe Diet Advancement: Fair Barriers to Reach Goals: Cognitive deficits      Swallow Study   General HPI: Levi Russell is a 85 yo M w/ PMH of T2DM, HLD, HTN and dementia presenting from his nursing home after being found altered with decreased responsiveness. Found to have UTI. CXR No evidence for acute cardiopulmonary abnormality. Type of Study: Bedside Swallow Evaluation Previous Swallow Assessment: (none) Diet Prior to this Study: NPO Temperature Spikes Noted: Yes Respiratory Status: Room air History of Recent Intubation: No Behavior/Cognition: Alert;Requires cueing;Other (Comment);Confused(irritated/dementia) Oral Cavity Assessment: Other (comment)(could not view) Oral Care Completed by SLP: Other (Comment)(unable due to pt uncooperative) Oral Cavity - Dentition: Other (Comment)(unkown, will assess, suspect endentulous) Vision: Impaired for self-feeding Self-Feeding Abilities: Total assist Patient Positioning: Postural control interferes with function;Other (comment)(legs contracted close to 90 degrees) Baseline Vocal Quality: Normal Volitional Cough: Cognitively unable to elicit Volitional Swallow: Unable to elicit    Oral/Motor/Sensory Function Overall Oral Motor/Sensory Function:  Other (comment)(pt did not cooperate)   Ice Chips Ice chips: Not tested   Thin Liquid Thin Liquid:  Impaired Presentation: Straw Oral Phase Impairments: Reduced labial seal;Reduced lingual movement/coordination;Poor awareness of bolus Oral Phase Functional Implications: Right anterior spillage;Oral residue Pharyngeal  Phase Impairments: Suspected delayed Swallow    Nectar Thick Nectar Thick Liquid: Not tested   Honey Thick Honey Thick Liquid: Not tested   Puree Puree: Impaired Oral Phase Impairments: Reduced labial seal;Reduced lingual movement/coordination;Poor awareness of bolus Oral Phase Functional Implications: Other (comment)(labial residue) Pharyngeal Phase Impairments: (none)   Solid     Solid: Not tested      Levi Russell, Levi Russell 12/30/2018,3:07 PM   Levi Russell M.Ed Nurse, children'sCCC-SLP Speech-Language Pathologist Pager [phone] Office [phone]

## 2018-12-30 NOTE — Progress Notes (Signed)
PHARMACY - PHYSICIAN COMMUNICATION CRITICAL VALUE ALERT - BLOOD CULTURE IDENTIFICATION (BCID)  Levi Russell is an 85 y.o. male who presented to [facility] on 12/29/2018 with a chief complaint of urosepsis  Assessment:  Blood cultures 1/4 with Ecoli - urinary source  Name of physician (or Provider) ContactedRana Snare, NP  Current antibiotics: Ceftriaxone 2gm IV q24h  Changes to prescribed antibiotics recommended:  Patient is on recommended antibiotics - No changes needed  Results for orders placed or performed during the hospital encounter of 12/29/18  Blood Culture ID Panel (Reflexed) (Collected: 12/29/2018  8:07 AM)  Result Value Ref Range   Enterococcus species NOT DETECTED NOT DETECTED   Listeria monocytogenes NOT DETECTED NOT DETECTED   Staphylococcus species NOT DETECTED NOT DETECTED   Staphylococcus aureus (BCID) NOT DETECTED NOT DETECTED   Streptococcus species NOT DETECTED NOT DETECTED   Streptococcus agalactiae NOT DETECTED NOT DETECTED   Streptococcus pneumoniae NOT DETECTED NOT DETECTED   Streptococcus pyogenes NOT DETECTED NOT DETECTED   Acinetobacter baumannii NOT DETECTED NOT DETECTED   Enterobacteriaceae species DETECTED (A) NOT DETECTED   Enterobacter cloacae complex NOT DETECTED NOT DETECTED   Escherichia coli DETECTED (A) NOT DETECTED   Klebsiella oxytoca NOT DETECTED NOT DETECTED   Klebsiella pneumoniae NOT DETECTED NOT DETECTED   Proteus species NOT DETECTED NOT DETECTED   Serratia marcescens NOT DETECTED NOT DETECTED   Carbapenem resistance NOT DETECTED NOT DETECTED   Haemophilus influenzae NOT DETECTED NOT DETECTED   Neisseria meningitidis NOT DETECTED NOT DETECTED   Pseudomonas aeruginosa NOT DETECTED NOT DETECTED   Candida albicans NOT DETECTED NOT DETECTED   Candida glabrata NOT DETECTED NOT DETECTED   Candida krusei NOT DETECTED NOT DETECTED   Candida parapsilosis NOT DETECTED NOT DETECTED   Candida tropicalis NOT DETECTED NOT DETECTED    Christoper Fabian, PharmD, BCPS Clinical pharmacist  **Pharmacist phone directory can now be found on [email or web address] ([email or web address]).  Listed under [facility]. 12/30/2018  12:17 AM

## 2018-12-30 NOTE — Progress Notes (Signed)
Medicine attending: I examined this patient today together with resident physician Dr. Judeth CornfieldJoshua Lee and I concur with his evaluation and management plan. Patient not very interactive.  Prefers to keep his eyes closed.  Will answer questions appropriately and cooperate with exam. Temperature trend down but peak temperature yesterday still up to 101.7.  White count has normalized. Enterobacter and E. coli growing in the urine.  E. coli growing in the blood.  Sensitivities pending. We will continue current antibiotics since he is clinically improving. Impression: E. coli urosepsis

## 2018-12-30 NOTE — Plan of Care (Signed)
  Problem: Activity: Goal: Risk for activity intolerance will decrease Outcome: Not Progressing   Problem: Nutrition: Goal: Adequate nutrition will be maintained Outcome: Not Progressing   

## 2018-12-31 LAB — CULTURE, BLOOD (ROUTINE X 2): Special Requests: ADEQUATE

## 2018-12-31 LAB — BASIC METABOLIC PANEL
ANION GAP: 12 (ref 5–15)
BUN: 23 mg/dL (ref 8–23)
CALCIUM: 8.2 mg/dL — AB (ref 8.9–10.3)
CO2: 26 mmol/L (ref 22–32)
Chloride: 103 mmol/L (ref 98–111)
Creatinine, Ser: 1.14 mg/dL (ref 0.61–1.24)
GFR calc Af Amer: >60 mL/min (ref >60)
GFR calc non Af Amer: 59 mL/min — ABNORMAL LOW (ref >60)
Glucose, Bld: 103 mg/dL — ABNORMAL HIGH (ref 70–99)
Potassium: 3.3 mmol/L — ABNORMAL LOW (ref 3.5–5.1)
Sodium: 141 mmol/L (ref 135–145)

## 2018-12-31 LAB — GLUCOSE, CAPILLARY
Glucose-Capillary: 89 mg/dL (ref 70–99)
Glucose-Capillary: 91 mg/dL (ref 70–99)
Glucose-Capillary: 81 mg/dL (ref 70–99)
Glucose-Capillary: 85 mg/dL (ref 70–99)
Glucose-Capillary: 118 mg/dL — ABNORMAL HIGH (ref 70–99)
Glucose-Capillary: 119 mg/dL — ABNORMAL HIGH (ref 70–99)

## 2018-12-31 LAB — URINE CULTURE: Culture: >=100000 — AB

## 2018-12-31 MED ORDER — POTASSIUM CHLORIDE 10 MEQ/100ML IV SOLN
10.0000 meq | INTRAVENOUS | Status: AC
  Administered 2018-12-31 (×2): 10 meq via INTRAVENOUS

## 2018-12-31 MED ORDER — POTASSIUM CHLORIDE 10 MEQ/100ML IV SOLN
10.0000 meq | INTRAVENOUS | Status: AC
  Administered 2018-12-31 (×2): 10 meq via INTRAVENOUS
  Filled 2018-12-31 (×2): qty 100

## 2018-12-31 NOTE — Progress Notes (Addendum)
   Subjective:  Levi Russell is a 85 y.o. with PMH of T2DM, HLD, HTN and dementia admit for E.Coli Bacteremia on hospital day 2  Levi Russell was examined and evaluated at bedside this AM. He is much more alert and able to follow directions. He is unable to answer orientation questions. He states he has some pain in his knees and just feels 'bad' but unable to provider further details. He is unable to answer ROS questions.  Objective:  Vital signs in last 24 hours: Vitals:   12/30/18 1209 12/30/18 2054 12/31/18 0002 12/31/18 0433  BP: 138/70 139/75  135/71  Pulse: 77 82  70  Resp: 20 16  16   Temp: 99 F (37.2 C) (!) 100.4 F (38 C) 99.4 F (37.4 C) (!) 97.5 F (36.4 C)  TempSrc: Axillary Axillary Axillary Oral  SpO2: 99% 97%  95%  Weight:      Height:       Gen: Cachetic appearing, NAD HEENT: Temporal wasting, Conjuctivae normal, PERRL Neck: supple, ROM intact CV: RRR, soft heart sounds Pulm: CTAB, No rales, no wheezes  Abd: Soft, BS+, NTND Extm: ROM intact, Peripheral pulses intact, No peripheral edema Skin: Dry, Warm, superficial fluid-filled bullae on bilateral eyelids Neuro: Alert but not oriented. Able to follow directions. GCS 15  Assessment/Plan:  Active Problems:   Complicated UTI (urinary tract infection)   Pressure injury of skin  Levi Russell is a 85 yo M w/ PMH of T2DM, HLD, HTN and dementia presenting from his nursing home with altered mental status found to have urosepsis. Urine culture shows sensitivity to ceftriaxone but resistant to Bactrim, Augmentin, Cipro. Have shown significant clinical improvement on cephalosporin and is sensitive to first gen ceph.  Likely able to be discharged soon on oral abx.  E.coli bacteremia most likely 2/2 urinary source Fever-free for 24 hours. WBC 11.0->9.8->7.0. Mentation appears back to baseline. Urine culture show E.Coli with sensitivity to cephalosporins. Repeat blood culture drawn this am. - C/w Ceftriaxone 2g daily - F/u  blood culture  DVT prophx: Lovenox Diet: DYS 1 Code: Full  Dispo: Anticipated discharge in approximately 0-1 day(s).   Theotis Barrio, MD 12/31/2018, 9:50 AM Pager: [phone]

## 2019-01-01 DIAGNOSIS — E876 Hypokalemia: Secondary | ICD-10-CM

## 2019-01-01 DIAGNOSIS — R42 Dizziness and giddiness: Secondary | ICD-10-CM

## 2019-01-01 DIAGNOSIS — R000 Tachycardia, unspecified: Secondary | ICD-10-CM

## 2019-01-01 DIAGNOSIS — R451 Restlessness and agitation: Secondary | ICD-10-CM

## 2019-01-01 LAB — BASIC METABOLIC PANEL
Anion gap: 8 (ref 5–15)
BUN: 23 mg/dL (ref 8–23)
CO2: 26 mmol/L (ref 22–32)
Calcium: 7.8 mg/dL — ABNORMAL LOW (ref 8.9–10.3)
Chloride: 106 mmol/L (ref 98–111)
Creatinine, Ser: 0.88 mg/dL (ref 0.61–1.24)
GFR calc Af Amer: >60 mL/min (ref >60)
GFR calc non Af Amer: >60 mL/min (ref >60)
Glucose, Bld: 144 mg/dL — ABNORMAL HIGH (ref 70–99)
Potassium: 3.4 mmol/L — ABNORMAL LOW (ref 3.5–5.1)
Sodium: 140 mmol/L (ref 135–145)

## 2019-01-01 LAB — CBC
HCT: 36.7 % — ABNORMAL LOW (ref 39.0–52.0)
Hemoglobin: 11.6 g/dL — ABNORMAL LOW (ref 13.0–17.0)
MCH: 28.1 pg (ref 26.0–34.0)
MCHC: 31.6 g/dL (ref 30.0–36.0)
MCV: 88.9 fL (ref 80.0–100.0)
PLATELETS: 169 10*3/uL (ref 150–400)
RBC: 4.13 MIL/uL — ABNORMAL LOW (ref 4.22–5.81)
RDW: 12.9 % (ref 11.5–15.5)
WBC: 5.8 10*3/uL (ref 4.0–10.5)
nRBC: 0 % (ref 0.0–0.2)

## 2019-01-01 LAB — MAGNESIUM: Magnesium: 1.5 mg/dL — ABNORMAL LOW (ref 1.7–2.4)

## 2019-01-01 LAB — GLUCOSE, CAPILLARY
GLUCOSE-CAPILLARY: 115 mg/dL — AB (ref 70–99)
Glucose-Capillary: 103 mg/dL — ABNORMAL HIGH (ref 70–99)
Glucose-Capillary: 142 mg/dL — ABNORMAL HIGH (ref 70–99)
Glucose-Capillary: 85 mg/dL (ref 70–99)
Glucose-Capillary: 89 mg/dL (ref 70–99)
Glucose-Capillary: 98 mg/dL (ref 70–99)

## 2019-01-01 MED ORDER — RISPERIDONE 0.5 MG PO TABS: 0.5000 mg | ORAL_TABLET | ORAL | Status: DC | PRN

## 2019-01-01 MED ORDER — LORAZEPAM 0.5 MG PO TABS
0.5000 mg | ORAL_TABLET | Freq: Four times a day (QID) | ORAL | Status: DC | PRN
  Administered 2019-01-01 – 2019-01-03 (×2): 0.5 mg via ORAL
  Filled 2019-01-01 (×2): qty 1

## 2019-01-01 MED ORDER — DICLOFENAC SODIUM 1 % TD GEL
2.0000 g | Freq: Three times a day (TID) | TRANSDERMAL | Status: DC | PRN
  Filled 2019-01-01: qty 100

## 2019-01-01 MED ORDER — MAGNESIUM SULFATE 2 GM/50ML IV SOLN
2.0000 g | Freq: Once | INTRAVENOUS | Status: AC
  Administered 2019-01-01: 2 g via INTRAVENOUS
  Filled 2019-01-01: qty 50

## 2019-01-01 MED ORDER — LACTATED RINGERS IV SOLN
INTRAVENOUS | Status: AC
  Administered 2019-01-01: 17:00:00 via INTRAVENOUS

## 2019-01-01 MED ORDER — RISPERIDONE 0.5 MG PO TABS
0.5000 mg | ORAL_TABLET | Freq: Two times a day (BID) | ORAL | Status: DC
  Administered 2019-01-01 – 2019-01-03 (×5): 0.5 mg via ORAL
  Filled 2019-01-01 (×5): qty 1

## 2019-01-01 MED ORDER — POTASSIUM CHLORIDE 20 MEQ/15ML (10%) PO SOLN
40.0000 meq | Freq: Every day | ORAL | Status: DC
  Administered 2019-01-01 – 2019-01-02 (×2): 40 meq via ORAL
  Filled 2019-01-01 (×2): qty 30

## 2019-01-01 MED ORDER — HYDROCODONE-ACETAMINOPHEN 5-325 MG PO TABS
1.0000 | ORAL_TABLET | Freq: Once | ORAL | Status: AC
  Administered 2019-01-01: 1 via ORAL
  Filled 2019-01-01: qty 1

## 2019-01-01 MED ORDER — TRAZODONE HCL 50 MG PO TABS: 50.0000 mg | ORAL_TABLET | Freq: Every evening | ORAL | Status: DC | PRN

## 2019-01-01 NOTE — Progress Notes (Signed)
Evaluated patient at bedside; RN had paged due to agitation and lower BP with complaints of dizziness.  On my arrival, Levi Russell was resting comfortably. He denied dizziness or chest pain. He states the only thing bothering him was a neck pain. He states he has eaten today and was continuing to work on eating a hamburger.   On discussing with RN, he did eat some lunch but she is unsure about breakfast.   BP today is lower in the 90s compared to 120s earlier this admission. Questionable PO intake.   Plan: --K pad and voltaren gel for neck pain --will give gentle fluids 82ml/hr x12 hours  --continue re-direction for agitation as possible; his antipsychotics have been reordered for today and should help if further agitation occurs overnight.  Nyra Market, MD IMTS - PGY3

## 2019-01-01 NOTE — Progress Notes (Signed)
Medicine attending: I examined this patient today together with resident physician Dr. Judeth Cornfield and I concur with his evaluation and management plan. He is generally uncomfortable but much more alert and interactive than on previous days.  No acute change in his exam. Sensitivities of the E. coli available and show the bug is sensitive to cephalosporins.  He is currently on ceftriaxone following initial dose of Maxipime day 4 IV antibiotics of planned 10-day course.  I feel he needs a minimum of 5 days intravenous antibiotic before transitioning to oral given bacteremia. He had a brief run of V. tach on telemetry monitor last night.  Labs shows hypomagnesemia and borderline hypokalemia.  Mineral and electrolyte replacement in progress.

## 2019-01-01 NOTE — Progress Notes (Signed)
Paged that patient was very agitated and needs pain medication.  Went to evaluate patient and he was sleeping comfortably.  Vitals stable. Nurse states that he wakes up intermittently becomes agitated and then goes back to sleep.  Asked nurse to page if this happens again to re-evaluate patient.

## 2019-01-01 NOTE — Progress Notes (Signed)
   Subjective:  Kreg Pitsenbarger is a 85 y.o. with PMH of T2DM, HLD, HTN and dementia admit for E.Coli Bacteremia on hospital day 3  Mr.Kresge was examined and evaluated at bedside this AM. He is alert and states he is having lot of agitation but is unable to describe reason. He states 'I just feel agitated and I don't know why'. He is AAOx2 but states he does not know why he is in the hospital. Described to him the reason for his admission but he states 'I will probably forget cause my mind isn't right'. Denies any significant dysuria or abdominal tenderness.  Objective:  Vital signs in last 24 hours: Vitals:   12/31/18 0433 12/31/18 1411 12/31/18 2125 01/01/19 0446  BP: 135/71 140/81 127/78 (!) 120/54  Pulse: 70 74 80 70  Resp: 16 16 18 18   Temp: (!) 97.5 F (36.4 C) 98.8 F (37.1 C) 99.1 F (37.3 C) 99.1 F (37.3 C)  TempSrc: Oral     SpO2: 95% 99% 98% 96%  Weight:      Height:       Gen: Cachetic appearing, NAD HEENT: Temporal wasting, Conjuctivae normal, PERRL CV: RRR, soft heart sounds Pulm: CTAB, No rales, no wheezes  Abd: Soft, BS+, NTND Extm: ROM intact, Peripheral pulses intact, No peripheral edema Neuro: AAOx2 to name and location  Assessment/Plan:  Active Problems:   Complicated UTI (urinary tract infection)   Pressure injury of skin  Mr.Glastetter is a 85 yo M w/ PMH of T2DM, HLD, HTN and dementia presenting from his nursing home with altered mental status found to have urosepsis. Blood culture results match urine culture results, confirming his E.coli bacteremia was due to urinary tract infection. Will continue admission for IV abx with transfer to PO after 5 days of treatment.  E.coli urosepsis Afebrile. WBC 11.0->9.8->7.0->5.8. Blood culture show E.coli Bacteremia sensitive to cephalosporins and Macrobid. Repeat blood culture NGTD - C/w Ceftriaxone 2g daily (4/10) - Can switch to Keflex after day 5  Hx of Dementia Episodes of anxiety overnight. Intermittently  agitated. Home anxiolytics were held on admission due to AMS. Will resume home psych meds - C/w home meds: Risperdal 0.5mg  BID, Trazodone 50mg  PRN, Lorazepam 0.5mg  q6hr PRN for anxiety  Episodes of VT Hx of left bundle branch block on old EKGs. 1 episode of V.tach on telemetry. Currently stable. Mag 1.5, K 3.4 repleted by night team - Trend BMP and Mag  DVT prophx: Lovenox Diet: DYS 1 Code: Full  Dispo: Anticipated discharge in approximately 1-2 day(s).   Theotis Barrio, MD 01/01/2019, 10:02 AM Pager: [phone]

## 2019-01-02 DIAGNOSIS — F0281 Dementia in other diseases classified elsewhere with behavioral disturbance: Secondary | ICD-10-CM

## 2019-01-02 DIAGNOSIS — G309 Alzheimer's disease, unspecified: Secondary | ICD-10-CM

## 2019-01-02 LAB — BASIC METABOLIC PANEL
Anion gap: 6 (ref 5–15)
BUN: 18 mg/dL (ref 8–23)
CO2: 30 mmol/L (ref 22–32)
Calcium: 7.6 mg/dL — ABNORMAL LOW (ref 8.9–10.3)
Chloride: 105 mmol/L (ref 98–111)
Creatinine, Ser: 0.79 mg/dL (ref 0.61–1.24)
GFR calc Af Amer: >60 mL/min (ref >60)
GFR calc non Af Amer: >60 mL/min (ref >60)
Glucose, Bld: 95 mg/dL (ref 70–99)
Potassium: 3.5 mmol/L (ref 3.5–5.1)
Sodium: 141 mmol/L (ref 135–145)

## 2019-01-02 LAB — GLUCOSE, CAPILLARY
GLUCOSE-CAPILLARY: 94 mg/dL (ref 70–99)
GLUCOSE-CAPILLARY: 94 mg/dL (ref 70–99)
GLUCOSE-CAPILLARY: 97 mg/dL (ref 70–99)
Glucose-Capillary: 102 mg/dL — ABNORMAL HIGH (ref 70–99)
Glucose-Capillary: 118 mg/dL — ABNORMAL HIGH (ref 70–99)
Glucose-Capillary: 120 mg/dL — ABNORMAL HIGH (ref 70–99)

## 2019-01-02 LAB — MAGNESIUM: MAGNESIUM: 1.8 mg/dL (ref 1.7–2.4)

## 2019-01-02 MED ORDER — CEPHALEXIN 500 MG PO CAPS: 500.0000 mg | ORAL_CAPSULE | Freq: Two times a day (BID) | ORAL | 0 refills | Status: DC

## 2019-01-02 NOTE — Progress Notes (Signed)
Per [facility], patient will require SNF rehab at discharge as they need patient to be able to walk. RN provided me with contact number for Sunny Schlein, only family contact [phone]). CSW left voicemail for her.   Osborne Casco Moani Weipert LCSW [phone]

## 2019-01-02 NOTE — Discharge Summary (Deleted)
Name: Levi Russell MRN: [id number] DOB: Jul 14, 1933 85 y.o. PCP: Patient, No Pcp Per  Date of Admission: 12/29/2018  7:48 AM Date of Discharge: 01/02/2019 Attending Physician: Levert Feinstein, MD  Discharge Diagnosis: 1. E.Coli bacteremia due to Urosepsis 2. Dementia 3. Hypomagnesemia  Discharge Medications: Allergies as of 01/02/2019   No Known Allergies     Medication List    TAKE these medications   cephALEXin 500 MG capsule Commonly known as:  KEFLEX Take 1 capsule (500 mg total) by mouth 2 (two) times daily for 5 days.   clotrimazole 1 % cream Commonly known as:  LOTRIMIN Apply 1 application topically 2 (two) times daily.   LORazepam 0.5 MG tablet Commonly known as:  ATIVAN Take 0.5 mg by mouth every 6 (six) hours as needed for anxiety.   metFORMIN 1000 MG tablet Commonly known as:  GLUCOPHAGE Take 1,000 mg by mouth 2 (two) times daily with a meal.   risperiDONE 0.5 MG tablet Commonly known as:  RISPERDAL Take 0.5 mg by mouth 2 (two) times daily at 10 am and 4 pm.   risperiDONE 1 MG tablet Commonly known as:  RISPERDAL Take 0.5 mg by mouth every 4 (four) hours as needed (psychosis).   traZODone 50 MG tablet Commonly known as:  DESYREL Take 50 mg by mouth at bedtime as needed for sleep.   vitamin B-12 500 MCG tablet Commonly known as:  CYANOCOBALAMIN Take 500 mcg by mouth daily.       Disposition and follow-up:   [name] was discharged from [facility] in Stable condition.  At the hospital follow up visit please address:  1. E.Coli bacteremia due to Urosepsis - Please ensure he finished his course of antibiotics (Keflex 500mg  BID for 5 more days) - Consider checking his electrolytes  2. Dementia - Please ensure he is on appropriate anxiolytics at home  3. Hypomagnesemia - Please ensure he had adequate oral intake - Consider checking his magnesium  2.  Labs / imaging needed at time of follow-up: bmp, cbc,  magnesium  3.  Pending labs/ test needing follow-up: none  Follow-up Appointments:   Hospital Course by problem list: 1. E.Coli bacteremia due to Urosepsis: [name] is a 85 yo M w/ PMH of T2DM, HLD, HTN and dementia presenting from his nursing home after being found altered. He had mild leukocytosis with leuk esterase and nitrates on UA with elevated lactate. Blood culture and urine culture both resulted in E.coli with sensitivities to cephalosporins. He was treated with IV ceftriaxone for 5 days with IV fluid supplementation. His leukocytosis resolved with stable vitals and discharged on oral Keflex with recommendation to continue 5 more days to complete 10 day abx course.   2. Dementia: Presented with history of dementia. Home dementia meds stopped in setting of AMS. Mentation and alertness improved with fluid and abx treatment. Had episodes of agitation overnight. Home psych meds re-started with cessation of agitation episodes. Discharged with recommendation to f/u with PCP.  3. Hypomagnesemia: During hospital stay, had intermittent episodes of SVTs on telemetry. Found to have hypomagnesemia, assumed to be due to poor oral intake from dementia. Magnesium replete via IV with cessation of arrhythmias.  Discharge Vitals:   BP (!) 113/52 (BP Location: Left Arm)   Pulse 74   Temp 99.6 F (37.6 C) (Axillary)   Resp 16   Ht 5\' 5"  (1.651 m)   Wt 55 kg   SpO2 97%   BMI 20.18 kg/m  Pertinent Labs, Studies, and Procedures:  CBC Latest Ref Rng & Units 01/01/2019 12/30/2018 12/29/2018  WBC 4.0 - 10.5 K/uL 5.8 7.0 9.8  Hemoglobin 13.0 - 17.0 g/dL 11.6(L) 11.8(L) 12.2(L)  Hematocrit 39.0 - 52.0 % 36.7(L) 36.9(L) 39.7  Platelets 150 - 400 K/uL 169 152 172   Blood Culture  Escherichia coli    MIC    AMPICILLIN >=32 RESIST... Resistant    AMPICILLIN/SULBACTAM >=32 RESIST... Resistant    CEFAZOLIN <=4 SENSITIVE  Sensitive    CEFTRIAXONE <=1 SENSITIVE  Sensitive    CIPROFLOXACIN >=4 RESISTANT   Resistant    Extended ESBL NEGATIVE  Sensitive    GENTAMICIN <=1 SENSITIVE  Sensitive    IMIPENEM <=0.25 SENS... Sensitive    NITROFURANTOIN <=16 SENSIT... Sensitive    PIP/TAZO <=4 SENSITIVE  Sensitive    TRIMETH/SULFA >=320 RESIS... Resistant    Discharge Instructions:  [name]  You came to [place] with altered mental status. You were found to have a UTI with sepsis. Here are our recommendations:  - Please take cephalexin (Keflex) 500mg  BID for 5 more days - Please take all of your other home meds as prescribed - Please make sure to have adequate fluid intake  Discharge Instructions    Call MD for:  difficulty breathing, headache or visual disturbances   Complete by:  As directed    Call MD for:  persistant dizziness or light-headedness   Complete by:  As directed    Call MD for:  persistant nausea and vomiting   Complete by:  As directed    Call MD for:  redness, tenderness, or signs of infection (pain, swelling, redness, odor or green/yellow discharge around incision site)   Complete by:  As directed    Diet - low sodium heart healthy   Complete by:  As directed    Increase activity slowly   Complete by:  As directed       Signed: Theotis Barrio, MD 01/02/2019, 10:24 AM   Pager: [phone]

## 2019-01-02 NOTE — Evaluation (Signed)
Physical Therapy Evaluation Patient Details Name: Levi Russell MRN: [id number] DOB: 1933/03/21 Today's Date: 01/02/2019   History of Present Illness  Levi Russell is an 85yo male who comes to [facility] on 2/8 from [facility] memory care after weakness adn lethargy, pt admitted with UTI. PMH: dementia, DM2.   Clinical Impression  Pt admitted with above diagnosis. Pt currently with functional limitations due to the deficits listed below (see "PT Problem List"). Upon entry, pt in bed, no family/caregiver present. The pt is appears to be asleep, but is responding to verbal stimulation in a clear audible voice intermittently. Purpose of the visit is explained several times, but patient has short term memory recall deficits and asks several times in session. Pt found to have bowel movement in bed, participatory while PT assist with hygiene, however pt is weak and unable to perform much rolling in bed in spite of efforts. The pt is oriented to self only, pleasant, minimally conversational, and following simple commands inconsistently. TotalA for all bed mobility to/from EOB. Pt agreeable to attempt STS transfer, but unable to help in a meaningful way, is mechanical lift transfer appropriate at this time. Functional mobility assessment demonstrates increased effort/time requirements, poor tolerance, and need for physical assistance, whereas the patient performed these at a higher level of independence PTA.  Pt will benefit from skilled PT intervention to increase independence and safety with basic mobility in preparation for discharge to the venue listed below.       Follow Up Recommendations SNF;Supervision for mobility/OOB;Supervision/Assistance - 24 hour    Equipment Recommendations  None recommended by PT    Recommendations for Other Services       Precautions / Restrictions Precautions Precautions: Fall Precaution Comments: Float heels Restrictions Weight Bearing Restrictions: No       Mobility  Bed Mobility Overal bed mobility: Needs Assistance Bed Mobility: Supine to Sit;Sit to Supine     Supine to sit: Total assist;+2 for physical assistance Sit to supine: Total assist;+2 for physical assistance   General bed mobility comments: does become more alert after 3-4 minutes at EOB, but unable to sit unassisted, collapsing back ward and to right   Transfers Overall transfer level: Needs assistance   Transfers: Sit to/from Stand Sit to Stand: +2 physical assistance;Total assist         General transfer comment: agreeable to attempt with assistance, but unable to contribute in a meaningful way  Ambulation/Gait Ambulation/Gait assistance: (pt unable)              Stairs            Wheelchair Mobility    Modified Rankin (Stroke Patients Only)       Balance Overall balance assessment: Needs assistance Sitting-balance support: Bilateral upper extremity supported;Feet supported Sitting balance-Leahy Scale: Zero                                       Pertinent Vitals/Pain Pain Assessment: No/denies pain    Home Living Family/patient expects to be discharged to:: Skilled nursing facility                 Additional Comments: dementia, coming in from [facility] memory care, previously ambulatory     Prior Function Level of Independence: Needs assistance   Gait / Transfers Assistance Needed: No caregiver available, pt unreliable historian           Hand  Dominance        Extremity/Trunk Assessment   Upper Extremity Assessment Upper Extremity Assessment: Generalized weakness    Lower Extremity Assessment Lower Extremity Assessment: Generalized weakness    Cervical / Trunk Assessment Cervical / Trunk Assessment: (unable to sit unassisted )  Communication      Cognition Arousal/Alertness: Lethargic Behavior During Therapy: WFL for tasks assessed/performed Overall Cognitive Status: History of  cognitive impairments - at baseline Area of Impairment: Following commands;Attention;Orientation;Memory                 Orientation Level: Person Current Attention Level: Selective Memory: Decreased recall of precautions;Decreased short-term memory Following Commands: Follows one step commands inconsistently              General Comments      Exercises     Assessment/Plan    PT Assessment Patient needs continued PT services  PT Problem List Decreased strength;Decreased activity tolerance;Decreased mobility       PT Treatment Interventions Functional mobility training;Therapeutic activities;Balance training;Therapeutic exercise;Patient/family education;Cognitive remediation    PT Goals (Current goals can be found in the Care Plan section)  Acute Rehab PT Goals PT Goal Formulation: Patient unable to participate in goal setting Time For Goal Achievement: 01/16/19    Frequency Min 2X/week   Barriers to discharge Decreased caregiver support will need more assistance than what is available in memory care    Co-evaluation               AM-PAC PT "6 Clicks" Mobility  Outcome Measure Help needed turning from your back to your side while in a flat bed without using bedrails?: Total Help needed moving from lying on your back to sitting on the side of a flat bed without using bedrails?: Total Help needed moving to and from a bed to a chair (including a wheelchair)?: Total Help needed standing up from a chair using your arms (e.g., wheelchair or bedside chair)?: Total Help needed to walk in hospital room?: Total Help needed climbing 3-5 steps with a railing? : Total 6 Click Score: 6    End of Session   Activity Tolerance: Patient limited by fatigue;No increased pain;Patient tolerated treatment well;Patient limited by lethargy Patient left: in bed;with call bell/phone within reach;with bed alarm set(heels floated) Nurse Communication: (bopwel movement cleaned,  heel dressings doffed, heels floated. ) PT Visit Diagnosis: Other abnormalities of gait and mobility (R26.89);Muscle weakness (generalized) (M62.81);Difficulty in walking, not elsewhere classified (R26.2);Other symptoms and signs involving the nervous system (R29.898)    Time: 1610-9604 PT Time Calculation (min) (ACUTE ONLY): 21 min   Charges:   PT Evaluation $PT Eval Moderate Complexity: 1 Mod          12:44 PM, 01/02/19 Rosamaria Lints, PT, DPT Physical Therapist - [place] [phone] (Pager)  [phone] (Office)      , C 01/02/2019, 12:40 PM

## 2019-01-02 NOTE — Care Management Important Message (Signed)
Important Message  Patient Details  Name: Levi Russell MRN: [id number] Date of Birth: 1933/01/13   Medicare Important Message Given:  Yes    Bowie Doiron Stefan Church 01/02/2019, 3:46 PM

## 2019-01-02 NOTE — Progress Notes (Signed)
   Subjective:  Levi Russell is a 85 y.o. with PMH of T2DM, HLD, HTN and dementia admit for E.Coli Bacteremia on hospital day 4  Levi Russell was examined and evaluated at bedside this AM. He was observed resting comfortably in bed. He continues to answer questions and follows some direction but is disoriented. Appears comfortable at this time. States he has been trying to eat and drink but untouched food tray at bedside.  Objective:  Vital signs in last 24 hours: Vitals:   01/01/19 1347 01/01/19 1353 01/01/19 2049 01/02/19 0433  BP: (!) 89/46 98/62 118/62 (!) 113/52  Pulse: 72 78 88 74  Resp: 14  16 16   Temp: 97.9 F (36.6 C)  99.5 F (37.5 C) 99.6 F (37.6 C)  TempSrc: Axillary  Oral Axillary  SpO2: 98%  97% 97%  Weight:      Height:       Gen: Cachetic appearing, NAD HEENT: Temporal wasting, Conjuctivae normal, PERRL CV: RRR, soft heart sounds Pulm: CTAB, No rales, no wheezes  Abd: Soft, BS+, NTND Extm: ROM intact, Peripheral pulses intact, No peripheral edema Neuro: AAOx2 to name and location  Assessment/Plan:  Active Problems:   Complicated UTI (urinary tract infection)   Pressure injury of skin   Hypomagnesemia  Levi Russell is a 85 yo M w/ PMH of T2DM, HLD, HTN and dementia presenting from his nursing home with altered mental status found to have urosepsis. Repeat blood culture show no growth, indicating treatment effect from ceftriaxone. Will discharge today on oral cephalosporin.  E.coli urosepsis Afebrile. BP dropped to 90s but increase with fluid resuscitation. Blood culture show E.coli Bacteremia sensitive to cephalosporins and Macrobid. Repeat blood culture NGTD - C/w Ceftriaxone 2g daily (5/10) - Discharge back to [facility] on Keflex  Hx of Dementia C/w Intermittently agitated. Home anxiolytics were held on admission due to AMS. Will resume home psych meds - C/w home meds: Risperdal 0.5mg  BID, Trazodone 50mg  PRN, Lorazepam 0.5mg  q6hr PRN for  anxiety  Episodes of VT K 3.4->3.5, Mag 1.5->1.8 No more VTs - Resolved  DVT prophx: Lovenox Diet: DYS 1 Code: Full  Dispo: Anticipated discharge in approximately today(s).   Theotis Barrio, MD 01/02/2019, 10:11 AM Pager: [phone]

## 2019-01-02 NOTE — NC FL2 (Signed)
[place] MEDICAID FL2 LEVEL OF CARE SCREENING TOOL     IDENTIFICATION  Patient Name: Levi Russell Birthdate: 04/18/1933 Sex: male Admission Date (Current Location): 12/29/2018  [facility] and [place] Number:  Producer, television/film/video and Address:  [facility] [place], [address] [place], [place] [address]      Provider Number: [id number]  Attending Physician Name and Address:  Levert Feinstein, MD  Relative Name and Phone Number:       Current Level of Care: Hospital Recommended Level of Care: Skilled Nursing Facility Prior Approval Number:    Date Approved/Denied:   PASRR Number: [id number]  Discharge Plan: SNF    Current Diagnoses: Patient Active Problem List   Diagnosis Date Noted  . Alzheimer's dementia with behavioral disturbance (HCC)   . Hypomagnesemia   . Complicated UTI (urinary tract infection) 12/29/2018  . Pressure injury of skin 12/29/2018  . Acute cystitis without hematuria   . Sepsis (HCC)   . Dehydration   . Dementia without behavioral disturbance (HCC)   . Severe protein-calorie malnutrition (HCC)   . Diabetes mellitus treated with oral medication (HCC)     Orientation RESPIRATION BLADDER Height & Weight     Self  Normal Incontinent Weight: 55 kg Height:  5\' 5"  (165.1 cm)  BEHAVIORAL SYMPTOMS/MOOD NEUROLOGICAL BOWEL NUTRITION STATUS      Continent Diet(No added table salt)  AMBULATORY STATUS COMMUNICATION OF NEEDS Skin   Extensive Assist Verbally PU Stage and Appropriate Care                       Personal Care Assistance Level of Assistance  Bathing, Feeding, Dressing Bathing Assistance: Maximum assistance Feeding assistance: Limited assistance Dressing Assistance: Limited assistance     Functional Limitations Info  Speech Sight Info: Adequate Hearing Info: Adequate Speech Info: Adequate    SPECIAL CARE FACTORS FREQUENCY  Speech therapy     PT Frequency: home health PT 3x/week OT Frequency: home health  OT 2x/week     Speech Therapy Frequency: 2x/week      Contractures Contractures Info: Not present    Additional Factors Info  Insulin Sliding Scale, Psychotropic Code Status Info: Full Allergies Info: NKA Psychotropic Info: Risperdal Insulin Sliding Scale Info: Insulin       Current Medications (01/02/2019):  This is the current hospital active medication list Current Facility-Administered Medications  Medication Dose Route Frequency Provider Last Rate Last Dose  . 0.9 %  sodium chloride infusion  250 mL Intravenous PRN Beola Cord, MD      . acetaminophen (TYLENOL) tablet 650 mg  650 mg Oral Q6H PRN Beola Cord, MD   650 mg at 01/01/19 2130   Or  . acetaminophen (TYLENOL) suppository 650 mg  650 mg Rectal Q6H PRN Beola Cord, MD   650 mg at 12/30/18 0631  . cefTRIAXone (ROCEPHIN) 2 g in sodium chloride 0.9 % 100 mL IVPB  2 g Intravenous Q24H Beola Cord, MD 200 mL/hr at 01/02/19 0804 2 g at 01/02/19 0804  . chlorhexidine (PERIDEX) 0.12 % solution 15 mL  15 mL Mouth Rinse BID Levert Feinstein, MD   15 mL at 01/02/19 1042  . diclofenac sodium (VOLTAREN) 1 % transdermal gel 2 g  2 g Topical TID PRN Nyra Market, MD      . enoxaparin (LOVENOX) injection 40 mg  40 mg Subcutaneous Q24H Beola Cord, MD   40 mg at 01/02/19 1042  . insulin aspart (novoLOG) injection  0-9 Units  0-9 Units Subcutaneous Q4H Beola CordMelvin, Alexander, MD   1 Units at 01/01/19 2100  . LORazepam (ATIVAN) tablet 0.5 mg  0.5 mg Oral Q6H PRN Theotis BarrioLee, Joshua K, MD   0.5 mg at 01/01/19 2130  . MEDLINE mouth rinse  15 mL Mouth Rinse q12n4p Levert FeinsteinGranfortuna, James M, MD   15 mL at 01/01/19 1657  . potassium chloride 20 MEQ/15ML (10%) solution 40 mEq  40 mEq Oral Daily Agyei, Obed K, MD   40 mEq at 01/02/19 1041  . risperiDONE (RISPERDAL) tablet 0.5 mg  0.5 mg Oral BID Theotis BarrioLee, Joshua K, MD   0.5 mg at 01/02/19 1041  . risperiDONE (RISPERDAL) tablet 0.5 mg  0.5 mg Oral Q4H PRN Theotis BarrioLee, Joshua K, MD      .  senna-docusate (Senokot-S) tablet 1 tablet  1 tablet Oral QHS PRN Beola CordMelvin, Alexander, MD      . sodium chloride flush (NS) 0.9 % injection 3 mL  3 mL Intravenous Q12H Beola CordMelvin, Alexander, MD   3 mL at 01/01/19 1025  . sodium chloride flush (NS) 0.9 % injection 3 mL  3 mL Intravenous PRN Beola CordMelvin, Alexander, MD      . traZODone (DESYREL) tablet 50 mg  50 mg Oral QHS PRN Theotis BarrioLee, Joshua K, MD         Discharge Medications: Please see discharge summary for a list of discharge medications.  Relevant Imaging Results:  Relevant Lab Results:   Additional Information SSN: [id number]  Wetzel Meester S [place], [place]

## 2019-01-02 NOTE — Progress Notes (Signed)
Per [facility], they need to find out if patient can walk. If he needs assistance or a wheelchair, they will have to send him to a different floor on the ALF side. CSW awaiting PT eval.  Cristobal Goldmann LCSW [phone]

## 2019-01-03 LAB — GLUCOSE, CAPILLARY
Glucose-Capillary: 90 mg/dL (ref 70–99)
Glucose-Capillary: 93 mg/dL (ref 70–99)
Glucose-Capillary: 98 mg/dL (ref 70–99)

## 2019-01-03 MED ORDER — SODIUM CHLORIDE 0.9 % IV SOLN
INTRAVENOUS | Status: DC
  Administered 2019-01-03: 07:00:00 via INTRAVENOUS

## 2019-01-03 MED ORDER — CEPHALEXIN 500 MG PO CAPS: 500.0000 mg | ORAL_CAPSULE | Freq: Two times a day (BID) | ORAL | 0 refills | Status: AC

## 2019-01-03 MED ORDER — LORAZEPAM 0.5 MG PO TABS: 0.5000 mg | ORAL_TABLET | Freq: Four times a day (QID) | ORAL | 0 refills | Status: AC | PRN

## 2019-01-03 NOTE — Progress Notes (Signed)
Patient's niece has selected [facility] and is there completing paperwork.   Osborne Casco Olyn Landstrom LCSW [phone]

## 2019-01-03 NOTE — Clinical Social Work Note (Signed)
Clinical Social Work Assessment  Patient Details  Name: Levi Russell MRN: [id number] Date of Birth: Apr 01, 1933  Date of referral:  01/03/19               Reason for consult:  Facility Placement                Permission sought to share information with:  Facility Medical sales representative, Family Supports Permission granted to share information::  Yes, Verbal Permission Granted  Name::     Levi Russell  Agency::  SNFs  Relationship::  Niece  Contact Information:  [phone]  Housing/Transportation Living arrangements for the past 2 months:  Assisted Living Facility Source of Information:  Other (Comment Required), Siblings Patient Interpreter Needed:  None Criminal Activity/Legal Involvement Pertinent to Current Situation/Hospitalization:  No - Comment as needed Significant Relationships:  Siblings, Other Family Members Lives with:  Facility Resident Do you feel safe going back to the place where you live?  No Need for family participation in patient care:  Yes (Comment)  Care giving concerns:  CSW received consult for possible SNF placement at time of discharge. CSW spoke with patient's niece and brother regarding PT recommendation of SNF placement at time of discharge. Patient's niece expressed understanding of PT recommendation and is agreeable to SNF placement at time of discharge. She states she will be the contact person to sign his admissions paperwork due to the patient not having any other family. At one point patient lived with them, but he declined with his memory and so went to [facility]. CSW to continue to follow and assist with discharge planning needs.   Social Worker assessment / plan:  CSW spoke with patient's niece concerning possibility of rehab at [facility] before returning home.  Employment status:  Retired Health and safety inspector:  Medicare PT Recommendations:  Skilled Nursing Facility Information / Referral to community resources:  Skilled Nursing  Facility  Patient/Family's Response to care:  Patient's niece recognizes need for rehab before returning home and is agreeable to a SNF in [place]. Patient reported preference for [facility] or [facility].   Patient/Family's Understanding of and Emotional Response to Diagnosis, Current Treatment, and Prognosis:  Patient/family is realistic regarding therapy needs and expressed being hopeful for SNF placement. Patient's niece expressed understanding of CSW role and discharge process as well as medical condition. No questions/concerns about plan or treatment.    Emotional Assessment Appearance:  Appears stated age Attitude/Demeanor/Rapport:  Unable to Assess Affect (typically observed):  Unable to Assess Orientation:  Oriented to Self Alcohol / Substance use:  Not Applicable Psych involvement (Current and /or in the community):  No (Comment)  Discharge Needs  Concerns to be addressed:  Care Coordination Readmission within the last 30 days:  No Current discharge risk:  None Barriers to Discharge:  No Barriers Identified   Mearl Latin, LCSW 01/03/2019, 12:52 PM

## 2019-01-03 NOTE — Progress Notes (Signed)
[facility] will [facility] to: [facility] Anticipated DC date: 01/03/2019 Family notified: Niece, Sunny Schlein and brother Transport by: [facility] 1:30pm   Per MD patient ready for DC to [facility]. RN, patient, patient's family, and facility notified of DC. Discharge Summary and FL2 sent to facility. RN to call report prior to discharge [phone]). DC packet on chart. Ambulance transport requested for patient.   CSW will sign off for now as social work intervention is no longer needed. Please consult [place] again if new needs arise.  Cristobal Goldmann, LCSW Clinical Social Worker [phone]

## 2019-01-03 NOTE — Discharge Instructions (Signed)
Levi Russell  You came to [place] with altered mental status. You were found to have a UTI with sepsis. Here are our recommendations:  - Please take cephalexin (Keflex) 500mg  BID for 4 more days - Please take all of your other home meds as prescribed - Please make sure to have adequate fluid intake   Urosepsis, Adult  Urosepsis is an infection that has spread to the blood (sepsis) from the kidneys, ureters, bladder, and urethra (urinary tract). These organs make, store, and pass urine from the body. Urosepsis is a severe illness that can be life-threatening if it is not treated immediately. What are the causes? Causes of this condition include:  A urinary tract infection (UTI) that spreads to your blood.  A urinary tract blockage (obstruction) due to kidney stones.  Having a small, thin tube in your urethra that drains urine from your bladder for a period of time (indwelling urinary catheter).  Swelling of the prostate (prostatitis) or prostate infection (abscess), in men. What increases the risk? This condition is more likely to develop in people who:  Are male.  Are 65 or older.  Have a long-term (chronic) disease, such as diabetes.  Have a weak disease-fighting system (immune system).  Have a condition that lessens or changes urine flow, such as a kidney or bladder stone, prostate disease, or a tumor of the urinary tract.  Have had surgery of the urinary tract.  Use a urinary catheter.  Have lost feeling below the waist or are in a wheelchair. What are the signs or symptoms? Early symptoms of this condition are similar to symptoms of a severe UTI. These may include:  Pain in your side, back, or lower abdomen.  Fever and chills.  Nausea and vomiting.  Frequent need to pass urine.  Burning pain when passing urine.  Bloody or cloudy urine.  Bad-smelling urine.  Fatigue. Once the infection has spread to the blood and a sepsis reaction starts, other symptoms may  include:  High fever.  Chills with shaking.  Fast breathing.  Fast heartbeat.  Cold and clammy skin.  Anxiety.  Confusion.  Severe pain in the abdomen.  Trouble breathing.  Trouble passing urine or not being able to pass urine.  Fainting. How is this diagnosed? This condition is diagnosed based on your symptoms, your medical history, and a physical exam. You may have urine tests or blood tests to check kidney function and look for infection. You may also have imaging tests to check for blockages in the urinary tract. How is this treated? This condition is a medical emergency that needs to be treated in the hospital. Treatment may include:  Receiving one or more of the following through an IV: ? Antibiotic medicines. ? Fluids. ? Medicines to support blood pressure.  Oxygen and breathing support.  Removing a urinary catheter that may be a source of infection, if you have one.  Filtering your blood with a machine (dialysis).  Surgery to drain infected areas or restore urine flow. This is rare. Follow these instructions at home:  Take over-the-counter and prescription medicines only as told by your health care provider.  If you were prescribed an antibiotic medicine to take at home, take it as told by your health care provider. Do not stop taking the antibiotic even if you start to feel better.  Drink enough fluid to keep your urine pale yellow.  Return to your normal activities as told by your health care provider. Ask your health care provider what activities  are safe for you.  Keep all follow-up visits as told by your health care provider. This is important. Contact a health care provider if you have:  Symptoms that get worse or do not get better with treatment.  New UTI symptoms. Get help right away if:  You have new or continued symptoms of sepsis after hospitalization, such as: ? High fever. ? Chills. ? Difficulty breathing. ? Confusion. ? Nausea and  vomiting. These symptoms may represent a serious problem that is an emergency. Do not wait to see if the symptoms will go away. Get medical help right away. Call your local emergency services (911 in the [place] Do not drive yourself to the hospital. Summary  Urosepsis is an infection that has spread to the blood (sepsis) from the urinary tract. It is a severe illness that can be life-threatening if it is not treated immediately.  Possible causes of urosepsis include a UTI that spreads to your blood, blockage from kidney stones, having a urinary catheter, or prostate swelling or infection.  This condition is a medical emergency that is treated in the hospital with antibiotics. This information is not intended to replace advice given to you by your health care provider. Make sure you discuss any questions you have with your health care provider. Document Released: 11/07/2005 Document Revised: 09/28/2017 Document Reviewed: 09/28/2017 Elsevier Interactive Patient Education  2019 [employer].

## 2019-01-03 NOTE — Progress Notes (Signed)
   Subjective:  Levi Russell is a 85 y.o. with PMH of T2DM, HLD, HTN and dementia admit for E.Coli Bacteremia on hospital day 5  Levi Russell was examined and evaluated at bedside this AM. He was observed resting comfortably in bed. He denies any abdominal discomfort, dysuria, pain. He states he is fine and has no complaints. Able to open his eyes and follow directions.  Objective:  Vital signs in last 24 hours: Vitals:   01/02/19 0433 01/02/19 1400 01/02/19 2030 01/03/19 0459  BP: (!) 113/52 119/60 135/67 119/73  Pulse: 74 78 78 78  Resp: 16 17 20 16   Temp: 99.6 F (37.6 C) 98.9 F (37.2 C) 98.5 F (36.9 C) 98.2 F (36.8 C)  TempSrc: Axillary Oral Oral Oral  SpO2: 97% 96% 98% 97%  Weight:      Height:       Gen: Cachetic appearing, NAD HEENT: Temporal wasting, Conjuctivae normal, PERRL, bilateral vision intact CV: RRR, soft heart sounds Pulm: CTAB, No rales, no wheezes  Abd: Soft, BS+, NTND Extm: ROM intact, Peripheral pulses intact, No peripheral edema Neuro: AAOx2 to name and location  Assessment/Plan:  Active Problems:   Complicated UTI (urinary tract infection)   Pressure injury of skin   Hypomagnesemia   Alzheimer's dementia with behavioral disturbance [facility])  Levi Russell is a 85 yo M w/ PMH of T2DM, HLD, HTN and dementia presenting from his nursing home with altered mental status found to have urosepsis. Planned for discharge yesterday but [facility], his nursing facility, requested physical therapy evaluation and was deemed more appropriate for discharge to SNF. Currently awaiting placement  E.coli urosepsis E.coli bacteremia sensitive to cephalosporins. Repeat blood culture NGTD - C/w Ceftriaxone 2g daily (6/10) - Will switch to oral at discharge  Hx of Dementia Well controlled on home meds - C/w home meds: Risperdal 0.5mg  BID, Trazodone 50mg  PRN, Lorazepam 0.5mg  q6hr PRN for anxiety  DVT prophx: Lovenox Diet: DYS 1 Code: Full  Dispo: Anticipated  discharge in approximately today(s).   Levi Barrio, MD 01/03/2019, 10:49 AM Pager: [phone]

## 2019-01-03 NOTE — Progress Notes (Signed)
CSW received call back from patient's niece and brother who will help sign him into SNF. They request [facility] SNF. CSW reaching out to facilities, no beds as of yet.   Levi Russell Levi Tabares LCSW [phone]

## 2019-01-03 NOTE — Discharge Summary (Addendum)
Name: Levi Russell MRN: [id number] DOB: Aug 05, 1933 85 y.o. PCP: Patient, No Pcp Per  Date of Admission: 12/29/2018  7:48 AM Date of Discharge: 01/03/2019 Attending Physician: Levert FeinsteinGranfortuna, James M, MD  Discharge Diagnosis: 1. E.Coli bacteremia due to Urosepsis 2. Dementia 3. Hypomagnesemia  Discharge Medications: Allergies as of 01/03/2019   No Known Allergies     Medication List    TAKE these medications   cephALEXin 500 MG capsule Commonly known as:  KEFLEX Take 1 capsule (500 mg total) by mouth 2 (two) times daily for 4 days.   clotrimazole 1 % cream Commonly known as:  LOTRIMIN Apply 1 application topically 2 (two) times daily.   LORazepam 0.5 MG tablet Commonly known as:  ATIVAN Take 0.5 mg by mouth every 6 (six) hours as needed for anxiety.   metFORMIN 1000 MG tablet Commonly known as:  GLUCOPHAGE Take 1,000 mg by mouth 2 (two) times daily with a meal.   risperiDONE 0.5 MG tablet Commonly known as:  RISPERDAL Take 0.5 mg by mouth 2 (two) times daily at 10 am and 4 pm.   risperiDONE 1 MG tablet Commonly known as:  RISPERDAL Take 0.5 mg by mouth every 4 (four) hours as needed (psychosis).   traZODone 50 MG tablet Commonly known as:  DESYREL Take 50 mg by mouth at bedtime as needed for sleep.   vitamin B-12 500 MCG tablet Commonly known as:  CYANOCOBALAMIN Take 500 mcg by mouth daily.       Disposition and follow-up:   Levi Russell [facility].  At the hospital follow up visit please address:  1. E.Coli bacteremia due to Urosepsis - Please ensure he finished his course of antibiotics (Keflex 500mg  BID for 4 more days) - Consider checking his electrolytes  2. Dementia - Please ensure he is on appropriate anxiolytics at home  3. Hypomagnesemia - Please ensure he had adequate oral intake - Consider checking his magnesium  2.  Labs / imaging needed at time of follow-up: bmp, cbc,  magnesium  3.  Pending labs/ test needing follow-up: none  Follow-up Appointments:   Hospital Course by problem list: 1. E.Coli bacteremia due to Urosepsis: [name] is a 85 yo M w/ PMH of T2DM, HLD, HTN and dementia presenting from his nursing home after being found altered. He had mild leukocytosis with leuk esterase and nitrates on UA with elevated lactate. Blood culture and urine culture both resulted in E.coli with sensitivities to cephalosporins. He was treated with IV ceftriaxone for 5 days with IV fluid supplementation. His leukocytosis resolved with stable vitals and discharged on oral Keflex with recommendation to continue 4 more days to complete 10 day abx course.   2. Dementia: Presented with history of dementia. Home dementia meds stopped in setting of AMS. Mentation and alertness improved with fluid and abx treatment. Had episodes of agitation overnight. Home psych meds re-started with cessation of agitation episodes. Discharged with recommendation to f/u with PCP.  3. Hypomagnesemia: During hospital stay, had intermittent episodes of SVTs on telemetry. Found to have hypomagnesemia, assumed to be due to poor oral intake from dementia. Magnesium replete via IV with cessation of arrhythmias.  Discharge Vitals:   BP 119/73 (BP Location: Right Arm)   Pulse 78   Temp 98.2 F (36.8 C) (Oral)   Resp 16   Ht 5\' 5"  (1.651 m)   Wt 55 kg   SpO2 97%   BMI 20.18 kg/m   Pertinent  Labs, Studies, and Procedures:  CBC Latest Ref Rng & Units 01/01/2019 12/30/2018 12/29/2018  WBC 4.0 - 10.5 K/uL 5.8 7.0 9.8  Hemoglobin 13.0 - 17.0 g/dL 11.6(L) 11.8(L) 12.2(L)  Hematocrit 39.0 - 52.0 % 36.7(L) 36.9(L) 39.7  Platelets 150 - 400 K/uL 169 152 172   Blood Culture  Escherichia coli    MIC    AMPICILLIN >=32 RESIST... Resistant    AMPICILLIN/SULBACTAM >=32 RESIST... Resistant    CEFAZOLIN <=4 SENSITIVE  Sensitive    CEFTRIAXONE <=1 SENSITIVE  Sensitive    CIPROFLOXACIN >=4 RESISTANT   Resistant    Extended ESBL NEGATIVE  Sensitive    GENTAMICIN <=1 SENSITIVE  Sensitive    IMIPENEM <=0.25 SENS... Sensitive    NITROFURANTOIN <=16 SENSIT... Sensitive    PIP/TAZO <=4 SENSITIVE  Sensitive    TRIMETH/SULFA >=320 RESIS... Resistant    Discharge Instructions:  Levi Russell  You came to [place] with altered mental status. You were found to have a UTI with sepsis. Here are our recommendations:  - Please take cephalexin (Keflex) 500mg  BID for 4 more days - Please take all of your other home meds as prescribed - Please make sure to have adequate fluid intake  Discharge Instructions    Call MD for:  difficulty breathing, headache or visual disturbances   Complete by:  As directed    Call MD for:  persistant dizziness or light-headedness   Complete by:  As directed    Call MD for:  persistant nausea and vomiting   Complete by:  As directed    Call MD for:  redness, tenderness, or signs of infection (pain, swelling, redness, odor or green/yellow discharge around incision site)   Complete by:  As directed    Diet - low sodium heart healthy   Complete by:  As directed    Increase activity slowly   Complete by:  As directed       Signed: Theotis Barrio, MD 01/03/2019, 12:33 PM   Pager: [phone]

## 2019-01-03 NOTE — Clinical Social Work Placement (Signed)
   CLINICAL SOCIAL WORK PLACEMENT  NOTE  Date:  01/03/2019  Patient Details  Name: Levi Russell MRN: [id number] Date of Birth: 10/23/1933  Clinical Social Work is seeking post-discharge placement for this patient at the Skilled  Nursing Facility level of care (*CSW will initial, date and re-position this form in  chart as items are completed):  Yes   Patient/family provided with [place] Clinical Social Work Department's list of facilities offering this level of care within the geographic area requested by the patient (or if unable, by the patient's family).  Yes   Patient/family informed of their freedom to choose among providers that offer the needed level of care, that participate in Medicare, Medicaid or managed care program needed by the patient, have an available bed and are willing to accept the patient.  Yes   Patient/family informed of [facility]'s ownership interest in [facility] and [facility], as well as of the fact that they are under no obligation to receive care at these facilities.  PASRR submitted to EDS on 01/02/19     PASRR number received on 01/02/19     Existing PASRR number confirmed on       FL2 transmitted to all facilities in geographic area requested by pt/family on 01/02/19     FL2 transmitted to all facilities within larger geographic area on       Patient informed that his/her managed care company has contracts with or will negotiate with certain facilities, including the following:        Yes   Patient/family informed of bed offers received.  Patient chooses bed at [facility]     Physician recommends and patient chooses bed at      Patient to be transferred to [facility] on 01/03/19.  Patient to be transferred to facility by [facility]     Patient family notified on 01/03/19 of transfer.  Name of family member notified:  Sunny Schlein, niece     PHYSICIAN       Additional Comment:     _______________________________________________ Mearl Latin, LCSW 01/03/2019, 12:59 PM

## 2019-01-03 NOTE — Progress Notes (Signed)
RN gave report to Tresa EndoKelly, [facility], all paperwork placed in DC packet. PIV x 1 removed and patient placed in paper gown for transport.

## 2019-01-05 LAB — CULTURE, BLOOD (ROUTINE X 2)
CULTURE: NO GROWTH
Culture: NO GROWTH
Special Requests: ADEQUATE
Special Requests: ADEQUATE

## 2019-02-20 DEATH — deceased

## 2019-05-13 IMAGING — CT CT CERVICAL SPINE W/O CM
3 of 7 series · 12 of 33 positions shown, 13 images · non-contrast
Comparison: 12/22/2017 head CT

CLINICAL DATA: Unwitnessed fall with neck pain. History of
dementia.

EXAM:
CT HEAD WITHOUT CONTRAST
CT CERVICAL SPINE WITHOUT CONTRAST
TECHNIQUE: Multidetector CT imaging of the head and cervical spine was
performed following the standard protocol without intravenous
contrast. Multiplanar CT image reconstructions of the cervical spine
were also generated.

[Series 6: coronal soft tissue · coronal · 0.29mm/px · 3 of 64 slices shown]
[im 16/64  bone]
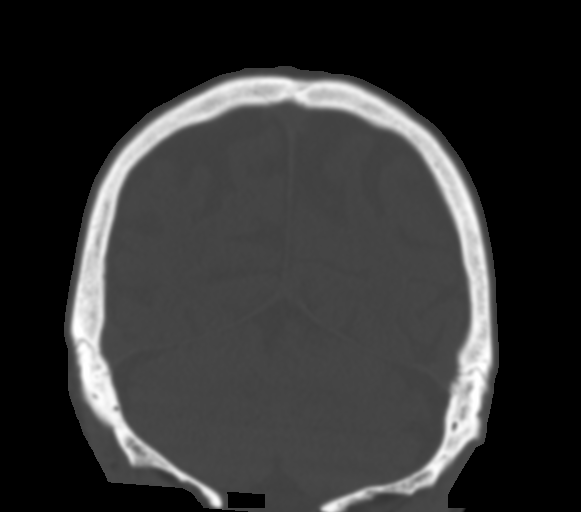
[im 32/64  bone]
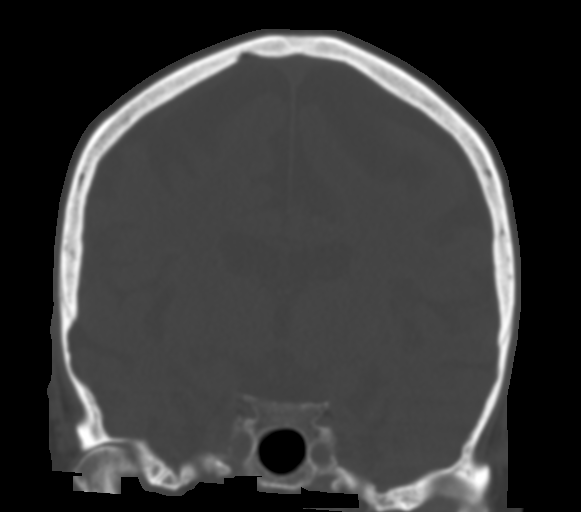
[im 48/64  bone]
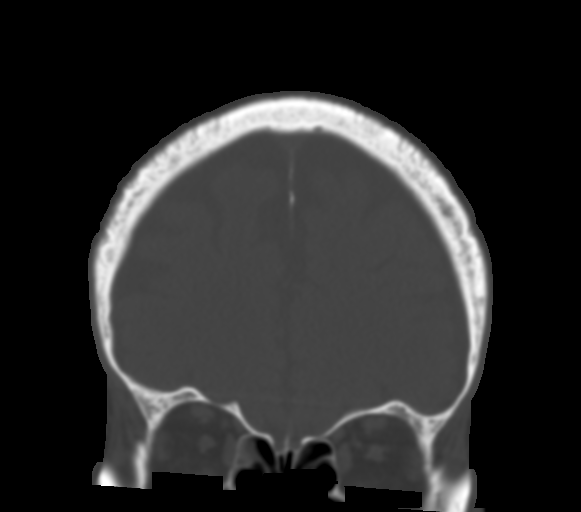

[Series 11: orthogonal bone · axial · 0.23mm/px · z∈[+1432,+1586]mm · 4 of 127 slices shown, 5 images]
[im 22/127  soft-tissue]
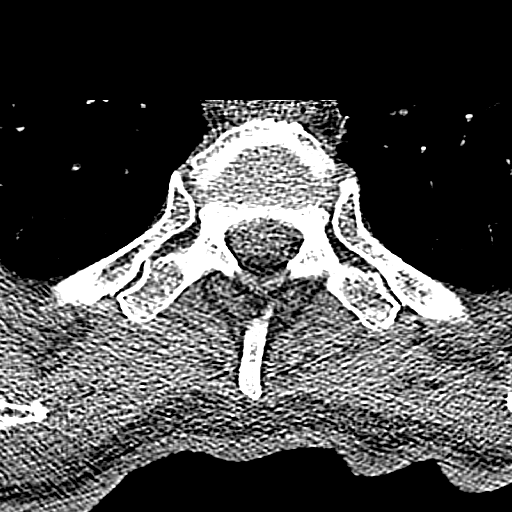
[im 22/127  bone]
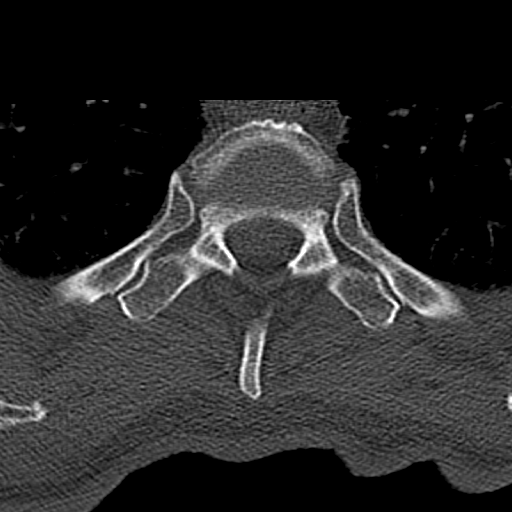
[im 43/127  bone]
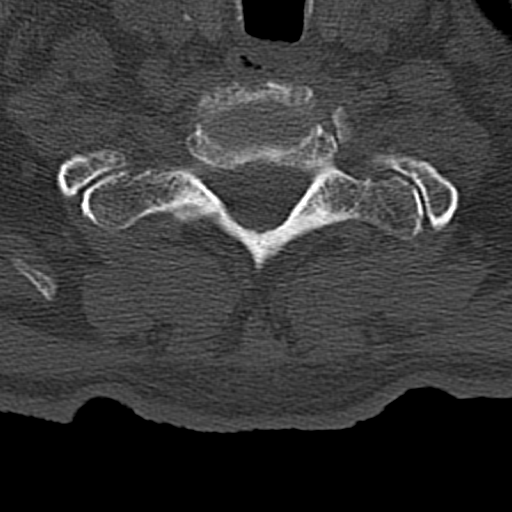
[im 85/127  bone]
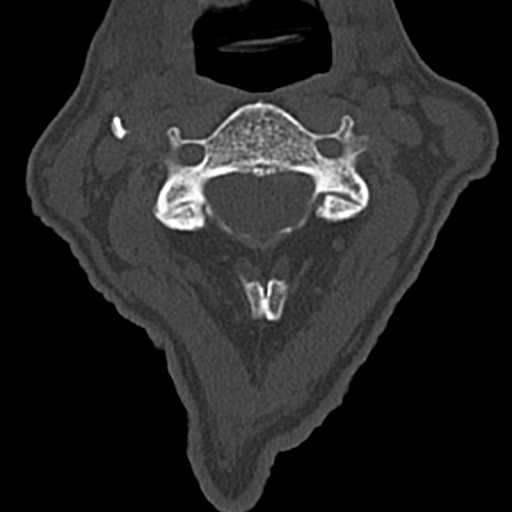
[im 106/127  bone]
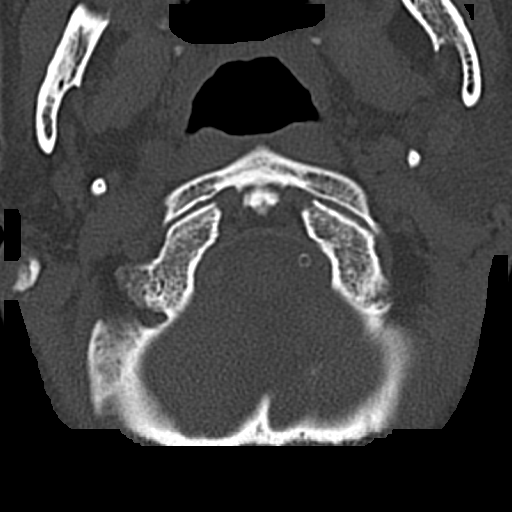

[Series 13: sagittal bone · sagittal · 0.23mm/px · 5 of 61 slices shown]
[im 11/61  bone]
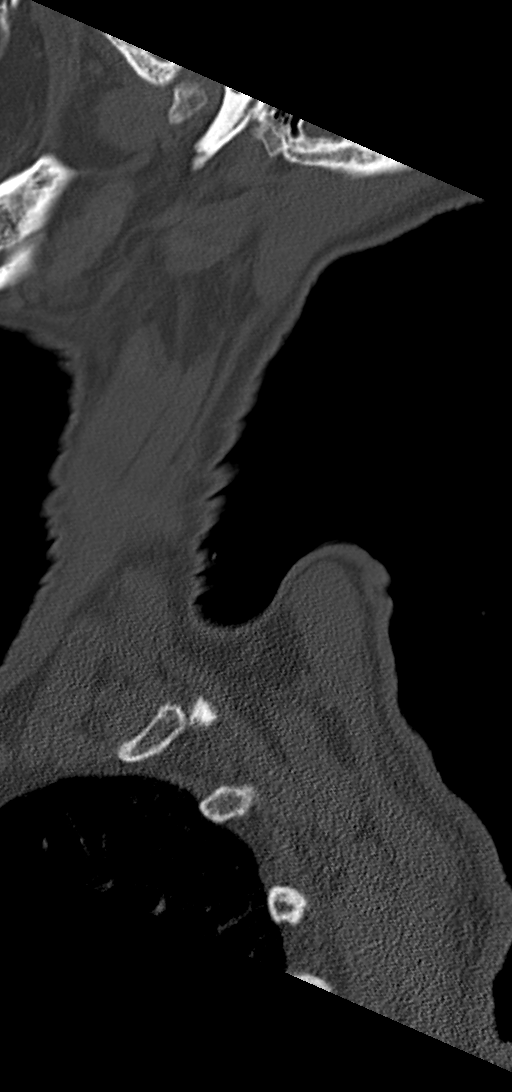
[im 21/61  bone]
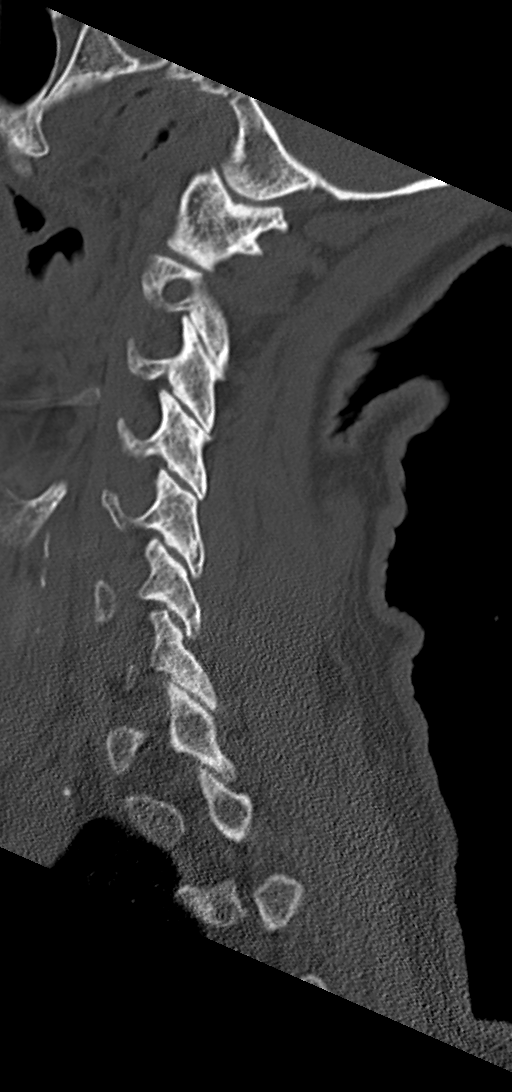
[im 31/61  bone]
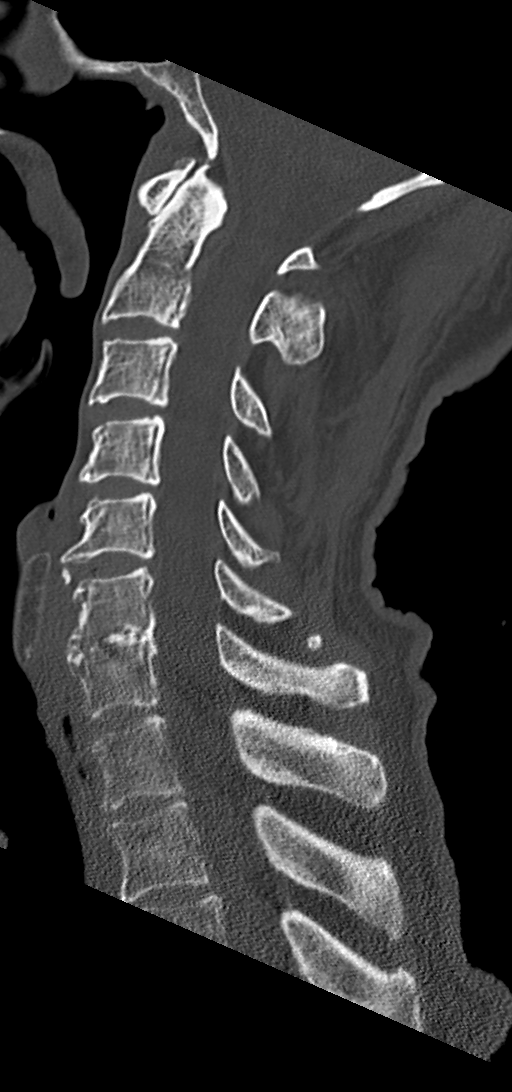
[im 41/61  bone]
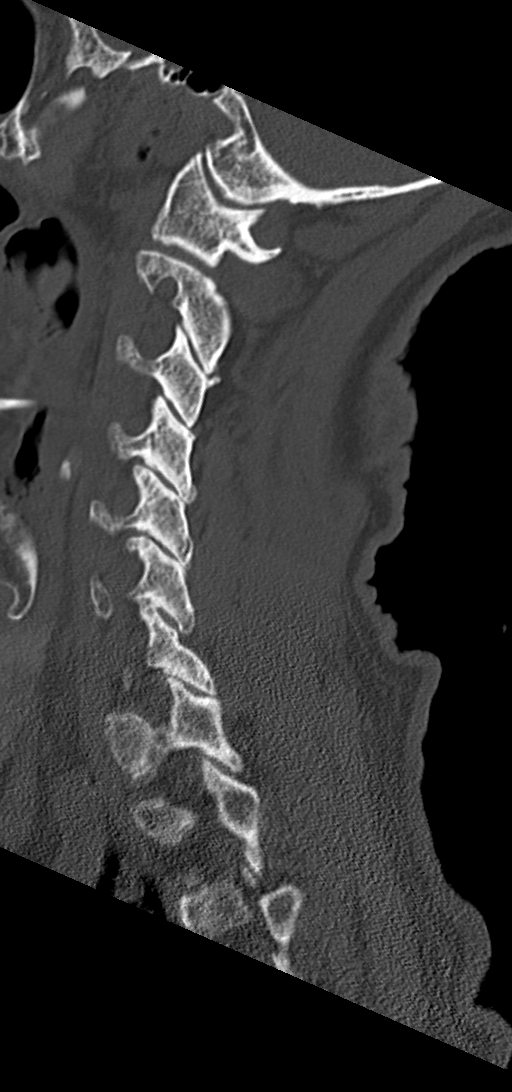
[im 51/61  bone]
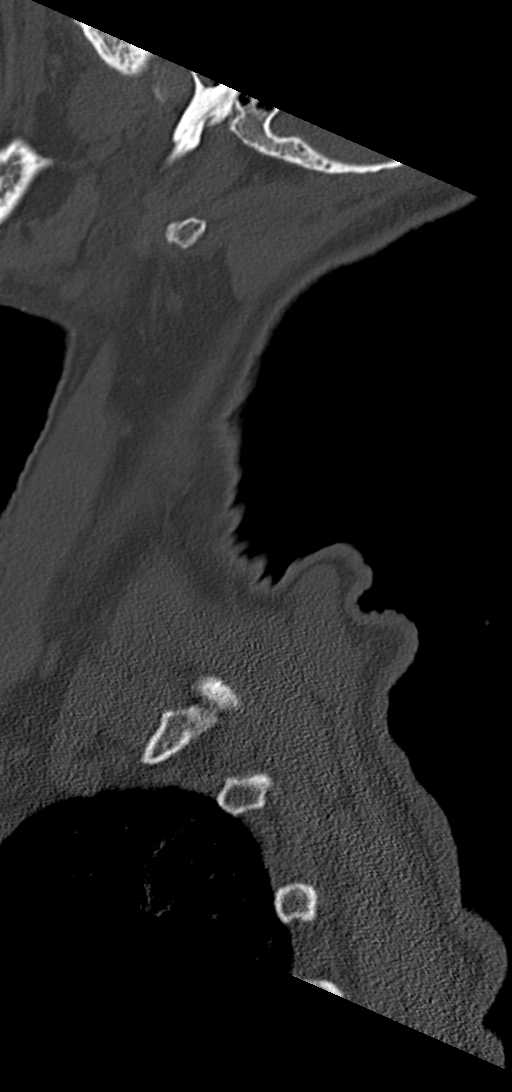

[12 of 33 positions shown; findings below may reference images not displayed]

FINDINGS: CT HEAD FINDINGS

Brain: No evidence of acute infarction, hemorrhage, hydrocephalus,
extra-axial collection or mass lesion/mass effect. Moderate cortical
atrophy most prominent in the high frontal parietal region. Patient
has history of dementia. Low densities below the bilateral putamen,
most likely dilated perivascular spaces.

Vascular: Atherosclerotic calcification.  No high-density vessel.

Skull: Negative.

Sinuses/Orbits: Negative

CT CERVICAL SPINE FINDINGS

Alignment: Normal alignment.

Skull base and vertebrae: Negative for fracture

Soft tissues and spinal canal: No prevertebral fluid or swelling. No
visible canal hematoma. Nonspecific asymmetric fatty atrophy of the
right parotid.

Disc levels: Generalized disc narrowing and facet spurring. C6-7
intervertebral ankylosis.

Upper chest: Negative
IMPRESSION: No evidence of acute intracranial or cervical spine injury.
